# Patient Record
Sex: Female | Born: 1985
Health system: Southern US, Community
[De-identification: ages and names within clinical notes are randomized; demographics above are authoritative.]

## PROBLEM LIST (undated history)

## (undated) DIAGNOSIS — N390 Urinary tract infection, site not specified: Secondary | ICD-10-CM

## (undated) DIAGNOSIS — N83209 Unspecified ovarian cyst, unspecified side: Secondary | ICD-10-CM

## (undated) DIAGNOSIS — N12 Tubulo-interstitial nephritis, not specified as acute or chronic: Secondary | ICD-10-CM

## (undated) HISTORY — DX: Unspecified ovarian cyst, unspecified side: N83.209

## (undated) HISTORY — DX: Tubulo-interstitial nephritis, not specified as acute or chronic: N12

## (undated) HISTORY — DX: Urinary tract infection, site not specified: N39.0

---
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
## 2005-01-29 ENCOUNTER — Observation Stay: Payer: Self-pay

## 2005-02-06 ENCOUNTER — Inpatient Hospital Stay: Payer: Self-pay | Admitting: Obstetrics & Gynecology

## 2006-06-03 ENCOUNTER — Emergency Department: Payer: Self-pay | Admitting: Emergency Medicine

## 2006-10-21 ENCOUNTER — Observation Stay: Payer: Self-pay

## 2006-10-27 ENCOUNTER — Observation Stay: Payer: Self-pay | Admitting: Obstetrics & Gynecology

## 2006-11-11 ENCOUNTER — Observation Stay: Payer: Self-pay

## 2007-01-08 ENCOUNTER — Inpatient Hospital Stay: Payer: Self-pay

## 2007-02-09 ENCOUNTER — Observation Stay: Payer: Self-pay

## 2007-02-17 ENCOUNTER — Observation Stay: Payer: Self-pay | Admitting: Unknown Physician Specialty

## 2007-02-21 ENCOUNTER — Observation Stay: Payer: Self-pay | Admitting: Unknown Physician Specialty

## 2007-02-25 ENCOUNTER — Inpatient Hospital Stay: Payer: Self-pay | Admitting: Obstetrics & Gynecology

## 2007-03-20 ENCOUNTER — Emergency Department: Payer: Self-pay | Admitting: General Practice

## 2007-03-22 ENCOUNTER — Emergency Department: Payer: Self-pay | Admitting: Emergency Medicine

## 2008-07-13 ENCOUNTER — Emergency Department: Payer: Self-pay | Admitting: Unknown Physician Specialty

## 2008-11-05 ENCOUNTER — Ambulatory Visit: Payer: Self-pay | Admitting: Family Medicine

## 2008-11-07 ENCOUNTER — Ambulatory Visit: Payer: Self-pay | Admitting: Internal Medicine

## 2008-11-10 ENCOUNTER — Ambulatory Visit: Payer: Self-pay | Admitting: Internal Medicine

## 2009-12-27 ENCOUNTER — Emergency Department: Payer: Self-pay | Admitting: Unknown Physician Specialty

## 2010-12-30 HISTORY — PX: WRIST SURGERY: SHX841

## 2011-06-29 ENCOUNTER — Emergency Department: Payer: Self-pay | Admitting: Emergency Medicine

## 2011-07-09 ENCOUNTER — Ambulatory Visit: Payer: Self-pay | Admitting: Orthopedic Surgery

## 2012-03-22 IMAGING — CR LEFT WRIST - COMPLETE 3+ VIEW
1 series · 4 of 4 positions shown · non-contrast
Comparison: none

REASON FOR EXAM: fall
COMMENTS:   LMP: N/A

PROCEDURE:     DXR - DXR WRIST LT COMP WITH OBLIQUES  - June 29, 2011  [DATE]
RESULT:     Transverse fracture is identified along the distal radius
demonstrating medial dorsal angulation and displacement. This fracture
appears to possibly project along the physeal remnant.

[Series 1: view not recorded · 0.17mm/px · 4 of 4 slices shown]
[im 1/4]
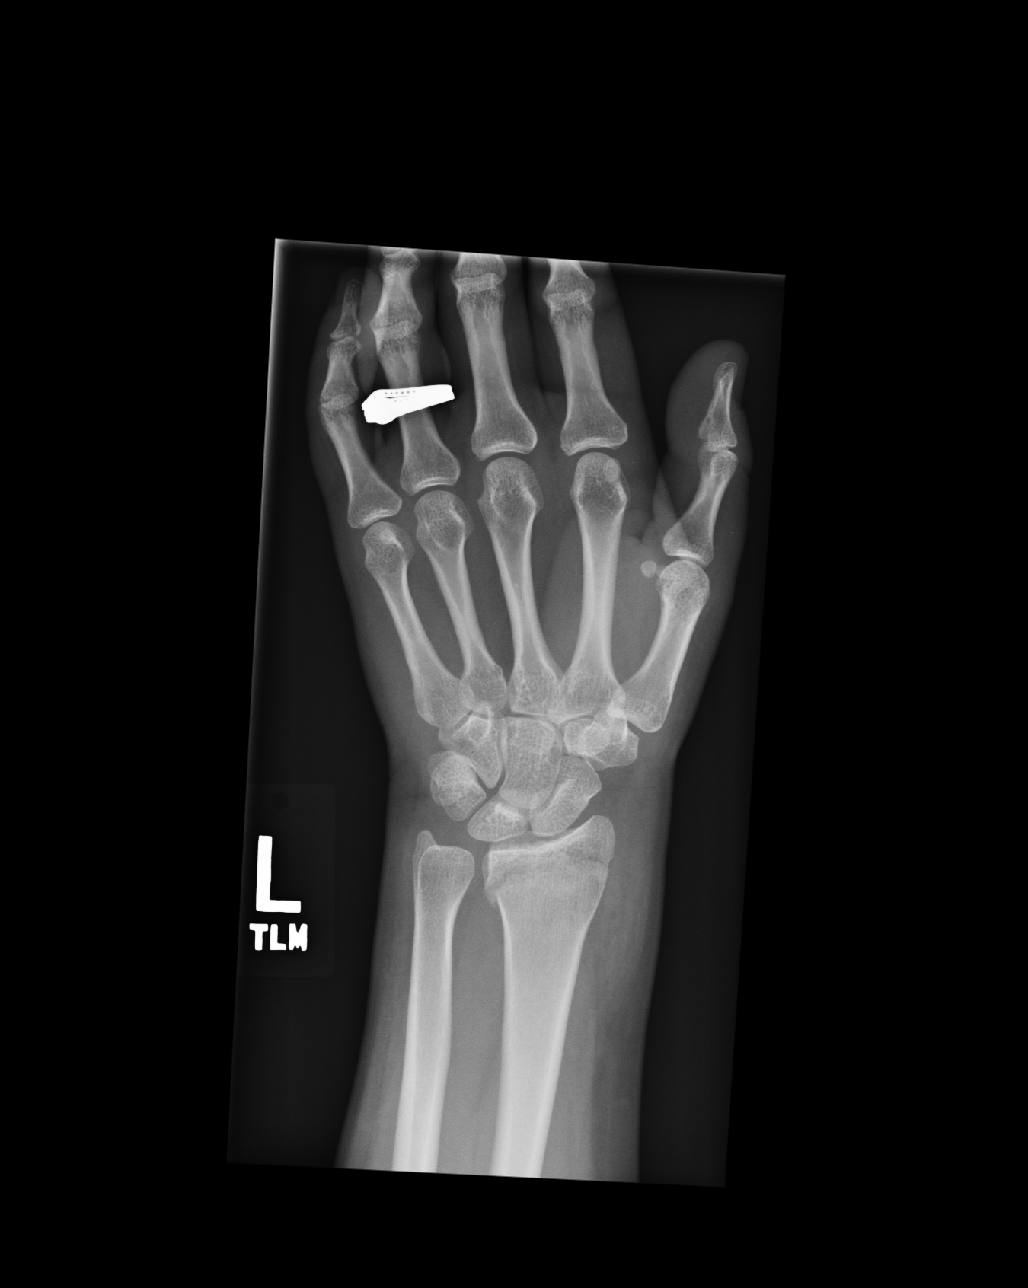
[im 2/4]
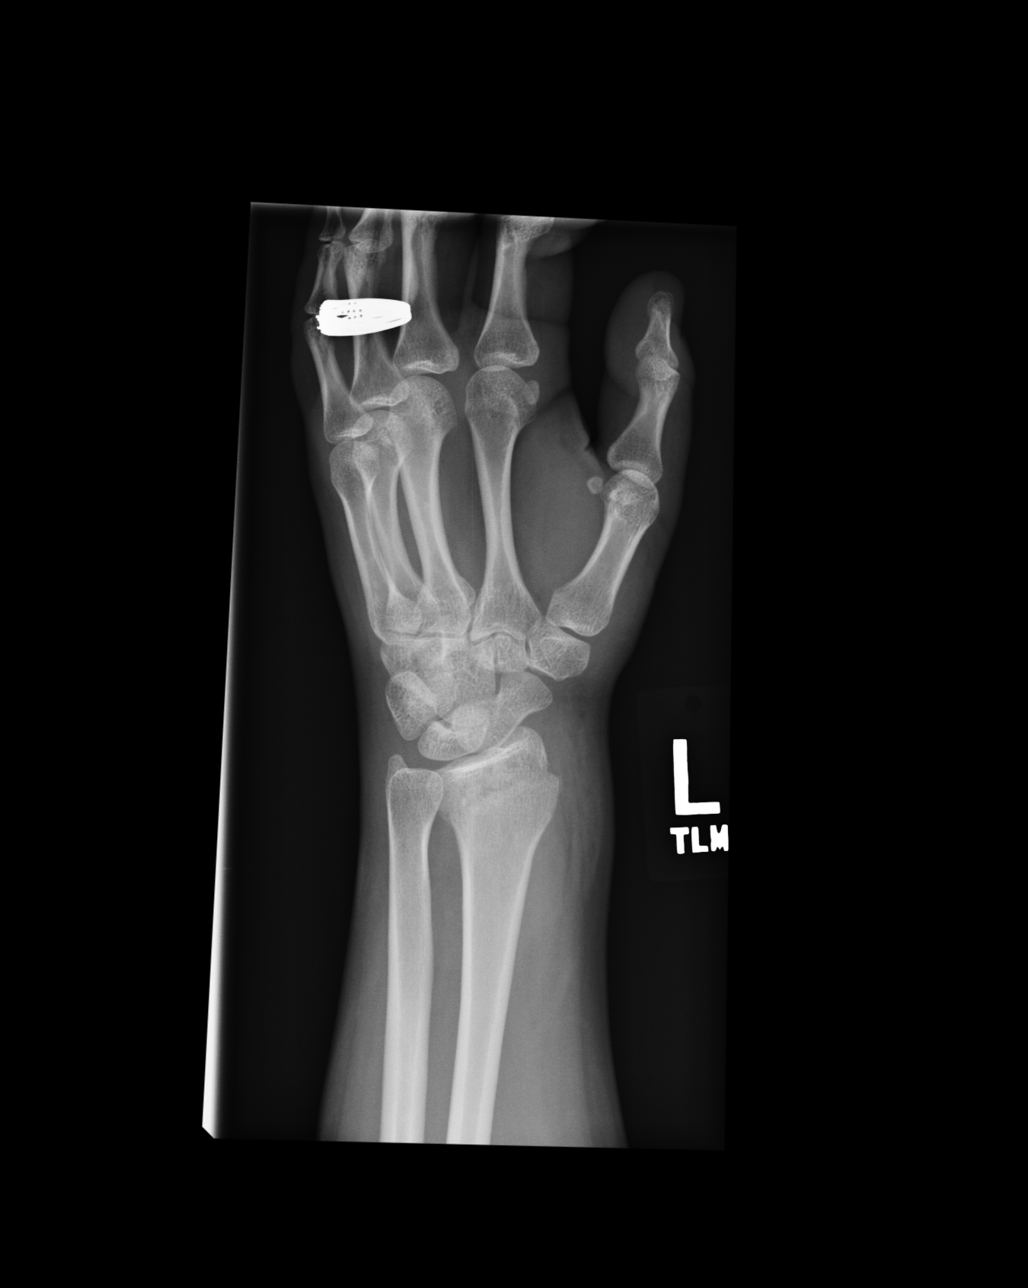
[im 3/4]
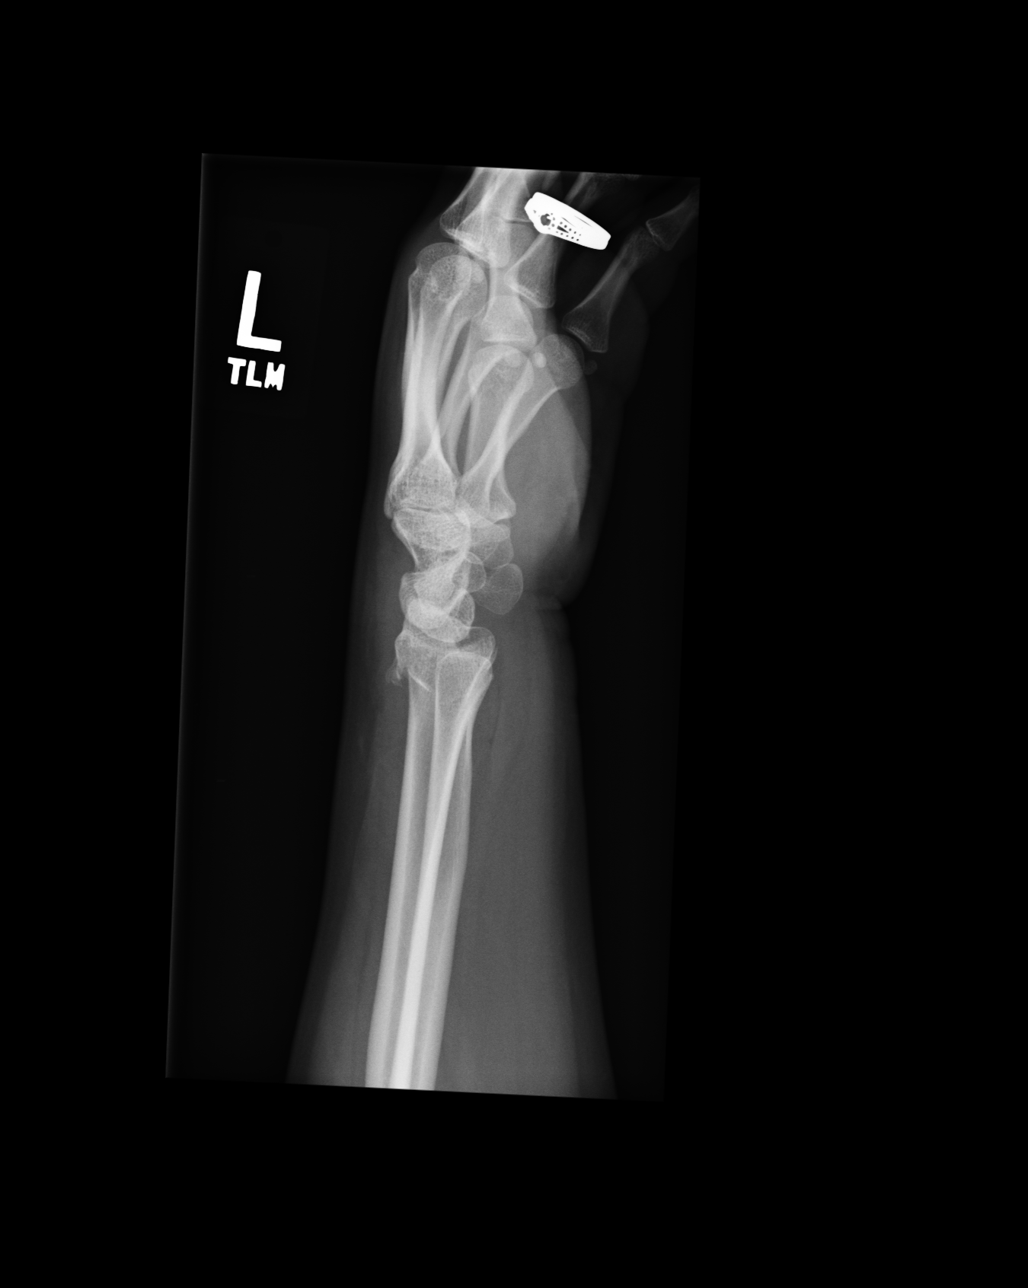
[im 4/4]
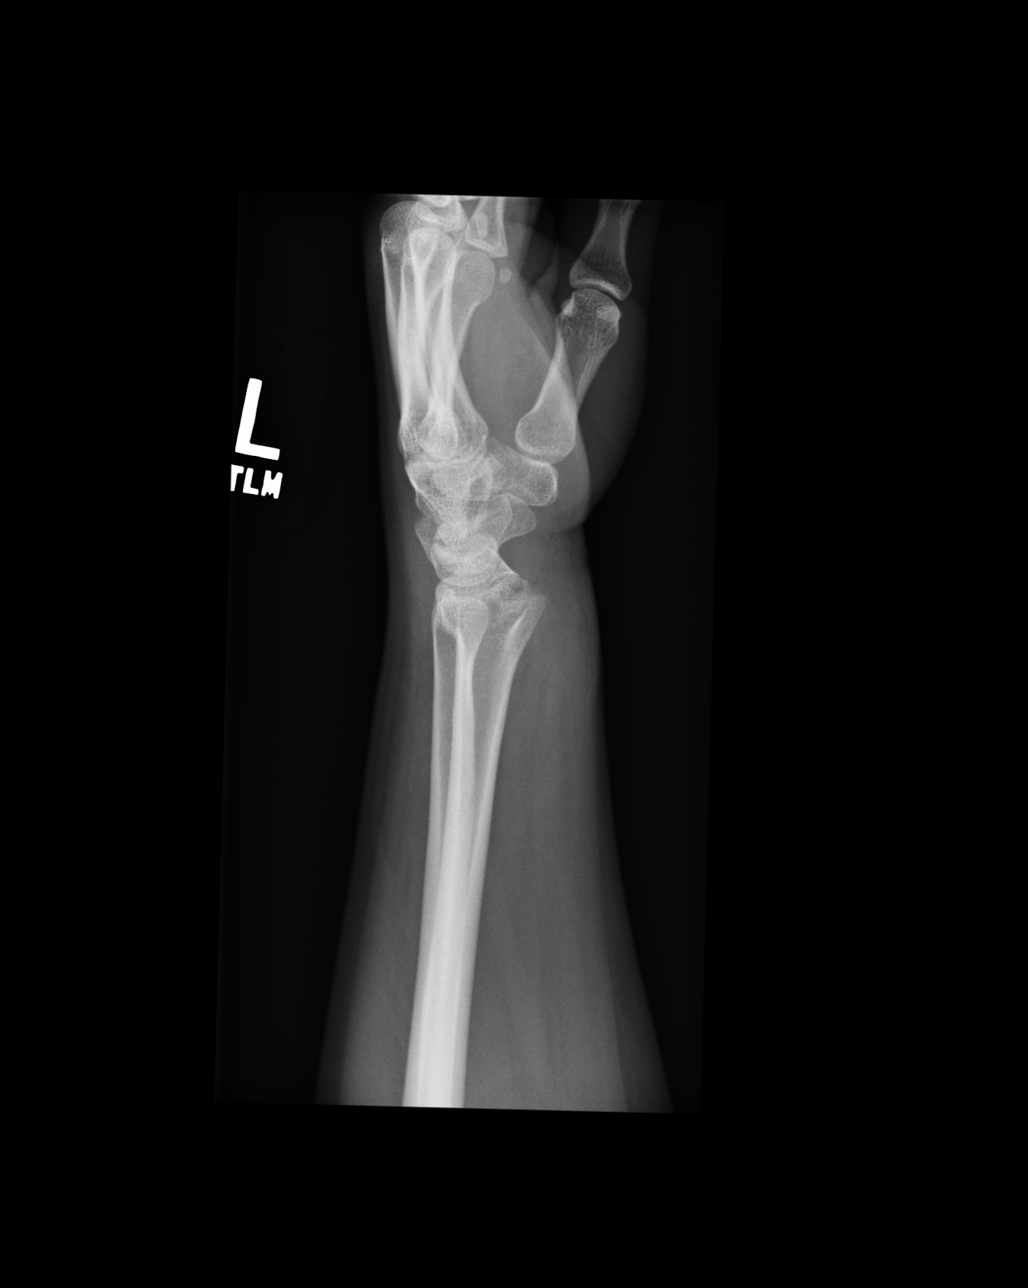

[4 of 4 positions shown; findings below may reference images not displayed]

IMPRESSION: Distal radius fracture.

## 2013-06-07 LAB — HM PAP SMEAR: HM Pap smear: NEGATIVE

## 2016-02-27 DIAGNOSIS — R300 Dysuria: Secondary | ICD-10-CM | POA: Diagnosis not present

## 2016-02-27 DIAGNOSIS — F17210 Nicotine dependence, cigarettes, uncomplicated: Secondary | ICD-10-CM | POA: Diagnosis not present

## 2016-02-27 DIAGNOSIS — Z6833 Body mass index (BMI) 33.0-33.9, adult: Secondary | ICD-10-CM | POA: Diagnosis not present

## 2016-04-11 ENCOUNTER — Encounter: Payer: Self-pay | Admitting: Family Medicine

## 2016-04-11 DIAGNOSIS — E669 Obesity, unspecified: Secondary | ICD-10-CM | POA: Diagnosis not present

## 2016-04-11 DIAGNOSIS — F17210 Nicotine dependence, cigarettes, uncomplicated: Secondary | ICD-10-CM | POA: Diagnosis not present

## 2016-04-11 DIAGNOSIS — Z0001 Encounter for general adult medical examination with abnormal findings: Secondary | ICD-10-CM | POA: Diagnosis not present

## 2016-04-11 DIAGNOSIS — Z124 Encounter for screening for malignant neoplasm of cervix: Secondary | ICD-10-CM | POA: Diagnosis not present

## 2016-04-11 DIAGNOSIS — N390 Urinary tract infection, site not specified: Secondary | ICD-10-CM | POA: Diagnosis not present

## 2016-04-15 LAB — HM PAP SMEAR: HM Pap smear: NEGATIVE

## 2016-07-03 ENCOUNTER — Encounter: Payer: Self-pay | Admitting: Family Medicine

## 2016-07-03 DIAGNOSIS — N390 Urinary tract infection, site not specified: Secondary | ICD-10-CM | POA: Diagnosis not present

## 2016-07-03 DIAGNOSIS — E669 Obesity, unspecified: Secondary | ICD-10-CM | POA: Diagnosis not present

## 2016-07-03 DIAGNOSIS — H60331 Swimmer's ear, right ear: Secondary | ICD-10-CM | POA: Diagnosis not present

## 2016-07-03 DIAGNOSIS — F17210 Nicotine dependence, cigarettes, uncomplicated: Secondary | ICD-10-CM | POA: Diagnosis not present

## 2016-08-09 DIAGNOSIS — J069 Acute upper respiratory infection, unspecified: Secondary | ICD-10-CM | POA: Diagnosis not present

## 2016-08-09 DIAGNOSIS — R300 Dysuria: Secondary | ICD-10-CM | POA: Diagnosis not present

## 2016-08-09 DIAGNOSIS — H6243 Otitis externa in other diseases classified elsewhere, bilateral: Secondary | ICD-10-CM | POA: Diagnosis not present

## 2016-08-09 DIAGNOSIS — F17210 Nicotine dependence, cigarettes, uncomplicated: Secondary | ICD-10-CM | POA: Diagnosis not present

## 2016-09-05 DIAGNOSIS — F17210 Nicotine dependence, cigarettes, uncomplicated: Secondary | ICD-10-CM | POA: Diagnosis not present

## 2016-09-05 DIAGNOSIS — R635 Abnormal weight gain: Secondary | ICD-10-CM | POA: Diagnosis not present

## 2016-09-05 DIAGNOSIS — Z6830 Body mass index (BMI) 30.0-30.9, adult: Secondary | ICD-10-CM | POA: Diagnosis not present

## 2016-11-05 DIAGNOSIS — R635 Abnormal weight gain: Secondary | ICD-10-CM | POA: Diagnosis not present

## 2016-11-05 DIAGNOSIS — F17210 Nicotine dependence, cigarettes, uncomplicated: Secondary | ICD-10-CM | POA: Diagnosis not present

## 2016-11-05 DIAGNOSIS — Z6830 Body mass index (BMI) 30.0-30.9, adult: Secondary | ICD-10-CM | POA: Diagnosis not present

## 2017-06-05 DIAGNOSIS — F17210 Nicotine dependence, cigarettes, uncomplicated: Secondary | ICD-10-CM | POA: Diagnosis not present

## 2017-06-05 DIAGNOSIS — E669 Obesity, unspecified: Secondary | ICD-10-CM | POA: Diagnosis not present

## 2017-06-05 DIAGNOSIS — N926 Irregular menstruation, unspecified: Secondary | ICD-10-CM | POA: Diagnosis not present

## 2017-06-09 ENCOUNTER — Encounter: Payer: Self-pay | Admitting: Family Medicine

## 2017-06-09 DIAGNOSIS — E069 Thyroiditis, unspecified: Secondary | ICD-10-CM | POA: Diagnosis not present

## 2017-06-09 DIAGNOSIS — D509 Iron deficiency anemia, unspecified: Secondary | ICD-10-CM | POA: Diagnosis not present

## 2017-06-09 LAB — HEPATIC FUNCTION PANEL
ALT: 14 (ref 7–35)
AST: 15 (ref 13–35)
Alkaline Phosphatase: 64 (ref 25–125)
Bilirubin, Total: 0.4

## 2017-06-09 LAB — CBC AND DIFFERENTIAL
HCT: 40 (ref 36–46)
Hemoglobin: 13.5 (ref 12.0–16.0)
Platelets: 269 (ref 150–399)
WBC: 7.8

## 2017-06-09 LAB — BASIC METABOLIC PANEL
BUN: 11 (ref 4–21)
Creatinine: 0.7 (ref 0.5–1.1)
Sodium: 141 (ref 137–147)

## 2017-06-09 LAB — IRON,TIBC AND FERRITIN PANEL
Ferritin: 97
Iron: 103

## 2017-06-09 LAB — TSH: TSH: 2.5 (ref 0.41–5.90)

## 2017-06-30 DIAGNOSIS — N926 Irregular menstruation, unspecified: Secondary | ICD-10-CM | POA: Diagnosis not present

## 2017-10-16 DIAGNOSIS — H8309 Labyrinthitis, unspecified ear: Secondary | ICD-10-CM | POA: Diagnosis not present

## 2017-11-26 DIAGNOSIS — H6243 Otitis externa in other diseases classified elsewhere, bilateral: Secondary | ICD-10-CM | POA: Diagnosis not present

## 2017-11-26 DIAGNOSIS — B359 Dermatophytosis, unspecified: Secondary | ICD-10-CM | POA: Diagnosis not present

## 2017-11-26 DIAGNOSIS — H8110 Benign paroxysmal vertigo, unspecified ear: Secondary | ICD-10-CM | POA: Diagnosis not present

## 2018-01-02 ENCOUNTER — Ambulatory Visit (INDEPENDENT_AMBULATORY_CARE_PROVIDER_SITE_OTHER): Payer: Self-pay | Admitting: Family

## 2018-01-02 ENCOUNTER — Encounter: Payer: Self-pay | Admitting: Family

## 2018-01-02 VITALS — BP 105/62 | HR 98 | Temp 98.2°F | Wt 155.0 lb

## 2018-01-02 DIAGNOSIS — J029 Acute pharyngitis, unspecified: Secondary | ICD-10-CM

## 2018-01-02 LAB — POCT INFLUENZA A/B
Influenza A, POC: NEGATIVE
Influenza B, POC: NEGATIVE

## 2018-01-02 LAB — POCT RAPID STREP A (OFFICE): Rapid Strep A Screen: NEGATIVE

## 2018-01-02 MED ORDER — AZITHROMYCIN 250 MG PO TABS: ORAL_TABLET | ORAL | 0 refills | Status: DC

## 2018-01-02 NOTE — Patient Instructions (Signed)

## 2018-01-02 NOTE — Progress Notes (Signed)
Subjective:     Patient ID: Anita Stanley, female   DOB: 1986-05-03, 32 y.o.   MRN: [id number]  HPI 32 year old female is in today with c/o sore throat, fatigue, headache, x 5 days and worsening. 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 Has been taking Ibuprofen and tylenol for fevers. Has not taken her temperature but feels she is having fevers.   Review of Systems  Constitutional: Positive for chills, fatigue and fever.  HENT: Positive for sore throat. Negative for rhinorrhea, sinus pain and voice change.   Respiratory: Negative for cough.   Cardiovascular: Negative.   Endocrine: Negative.   Allergic/Immunologic: Negative.   Neurological: Negative.   Hematological: Negative.    History reviewed. No pertinent past medical history.  Social History   Socioeconomic History  . Marital status: Married    Spouse name: Not on file  . Number of children: Not on file  . Years of education: Not on file  . Highest education level: Not on file  Social Needs  . Financial resource strain: Not on file  . Food insecurity - worry: Not on file  . Food insecurity - inability: Not on file  . Transportation needs - medical: Not on file  . Transportation needs - non-medical: Not on file  Occupational History  . Not on file  Tobacco Use  . Smoking status: Not on file  Substance and Sexual Activity  . Alcohol use: Not on file  . Drug use: Not on file  . Sexual activity: Not on file  Other Topics Concern  . Not on file  Social History Narrative  . Not on file    History reviewed. No pertinent surgical history.  History reviewed. No pertinent family history.  Allergies  Allergen Reactions  . Morphine And Related     Current Outpatient Medications on File Prior to Visit  Medication Sig Dispense Refill  . levonorgestrel (MIRENA) 20 MCG/24HR IUD 1 each by Intrauterine route once.     No current facility-administered medications on file prior to visit.      BP 105/62   Pulse 98   Temp 98.2 F (36.8 C)   Wt 155 lb (70.3 kg)   SpO2 97% chart    Objective:   Physical Exam  Constitutional: She is oriented to person, place, and time. She appears well-developed and well-nourished.  HENT:  Right Ear: External ear normal.  Left Ear: External ear normal.  Pharynx moderately red, no exudate  Neck: Normal range of motion. Neck supple.  Cardiovascular: Normal rate, regular rhythm and normal heart sounds.  Pulmonary/Chest: Effort normal and breath sounds normal.  Musculoskeletal: Normal range of motion.  Neurological: She is alert and oriented to person, place, and time.  Skin: Skin is warm and dry.  Psychiatric: She has a normal mood and affect.       Assessment:        Anita Folksmanda was seen today for focus-[place]-sorethroat/bodyache.  Diagnoses and all orders for this visit:  Sorethroat -     POCT Influenza A/B -     POCT rapid strep A  Pharyngitis, unspecified etiology  Other orders -     azithromycin (ZITHROMAX) 250 MG tablet; 2 Tabs today, then 1 tab daily x 4 more days   Plan:     Call the office with any questions or concerns. RTC if symptoms worsen or persist.

## 2018-06-24 ENCOUNTER — Ambulatory Visit (INDEPENDENT_AMBULATORY_CARE_PROVIDER_SITE_OTHER): Payer: No Typology Code available for payment source | Admitting: Family Medicine

## 2018-06-24 ENCOUNTER — Encounter: Payer: Self-pay | Admitting: Family Medicine

## 2018-06-24 VITALS — BP 100/60 | HR 76 | Temp 98.3°F | Ht 61.0 in | Wt 161.0 lb

## 2018-06-24 DIAGNOSIS — R350 Frequency of micturition: Secondary | ICD-10-CM

## 2018-06-24 DIAGNOSIS — R635 Abnormal weight gain: Secondary | ICD-10-CM

## 2018-06-24 DIAGNOSIS — R5383 Other fatigue: Secondary | ICD-10-CM | POA: Diagnosis not present

## 2018-06-24 DIAGNOSIS — Z0000 Encounter for general adult medical examination without abnormal findings: Secondary | ICD-10-CM | POA: Diagnosis not present

## 2018-06-24 NOTE — Patient Instructions (Signed)
Good to see you today  I will notify you of lab results   Keeping You Healthy  Get These Tests 1. Blood Pressure- Have your blood pressure checked once a year by your health care provider.  Normal blood pressure is 120/80. 2. Weight- Have your body mass index (BMI) calculated to screen for obesity.  BMI is measure of body fat based on height and weight.  You can also calculate your own BMI at https://www.west-esparza.com/www.nhlbisupport.com/bmi/. 3. Cholesterol- Have your cholesterol checked every 5 years starting at age 32 then yearly starting at age 645. 4. Chlamydia, HIV, and other sexually transmitted diseases- Get screened every year until age 32, then within three months of each new sexual provider. 5. Pap Test - Every 1-5 years; discuss with your health care provider. 6. Mammogram- Every 1-2 years starting at age 32--50  Take these medicines  Calcium with Vitamin D-Your body needs 1200 mg of Calcium each day and [phone] IU of Vitamin D daily.  Your body can only absorb 500 mg of Calcium at a time so Calcium must be taken in 2 or 3 divided doses throughout the day.  Multivitamin with folic acid- Once daily if it is possible for you to become pregnant.  Get these Immunizations  Gardasil-Series of three doses; prevents HPV related illness such as genital warts and cervical cancer.  Menactra-Single dose; prevents meningitis.  Tetanus shot- Every 10 years.  Flu shot-Every year.  Take these steps 1. Do not smoke-Your healthcare provider can help you quit.  For tips on how to quit go to www.smokefree.gov or call 1-800 QUITNOW. 2. Be physically active- Exercise 5 days a week for at least 30 minutes.  If you are not already physically active, start slow and gradually work up to 30 minutes of moderate physical activity.  Examples of moderate activity include walking briskly, dancing, swimming, bicycling, etc. 3. Breast Cancer- A self breast exam every month is important for early detection of breast cancer.  For  more information and instruction on self breast exams, ask your healthcare provider or [place].eswww.womenshealth.gov/faq/breast-self-exam.cfm. 4. Eat a healthy diet- Eat a variety of healthy foods such as fruits, vegetables, whole grains, low fat milk, low fat cheeses, yogurt, lean meats, poultry and fish, beans, nuts, tofu, etc.  For more information go to www. Thenutritionsource.org 5. Drink alcohol in moderation- Limit alcohol intake to one drink or less per day. Never drink and drive. 6. Depression- Your emotional health is as important as your physical health.  If you're feeling down or losing interest in things you normally enjoy please talk to your healthcare provider about being screened for depression. 7. Dental visit- Brush and floss your teeth twice daily; visit your dentist twice a year. 8. Eye doctor- Get an eye exam at least every 2 years. 9. Helmet use- Always wear a helmet when riding a bicycle, motorcycle, rollerblading or skateboarding. 10. Safe sex- If you may be exposed to sexually transmitted infections, use a condom. 11. Seat belts- Seat belts can save your live; always wear one. 12. Smoke/Carbon Monoxide detectors- These detectors need to be installed on the appropriate level of your home. Replace batteries at least once a year. 13. Skin cancer- When out in the sun please cover up and use sunscreen 15 SPF or higher. 14. Violence- If anyone is threatening or hurting you, please tell your healthcare provider.

## 2018-06-24 NOTE — Progress Notes (Signed)
Subjective:    Patient ID: Anita Stanley, female    DOB: 1986-02-15, 32 y.o.   MRN: [id number]  HPI This is a 32 yo female who presents today to establish care and for CPE. 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 She has a son and daughter (50, 39). Enjoys shopping, travel. Lives with husband and children. Has a dog.   Last CPE- sees gyn ([facility]) Pap- sees gyn Tdap- unknown, probably 2015 when she started working for [employer]- annual Eye- not regular Dental- regular Exercise- not regular Sleep- works third shift, sleeps 12 hours  Weight gain- has taken phentermine in past  No past medical history on file. No past surgical history on file. Family History  Problem Relation Age of Onset  . Diabetes Mother   . Diabetes Father   . Alcohol abuse Sister    Social History   Tobacco Use  . Smoking status: Current Some Day Smoker  . Smokeless tobacco: Never Used  Substance Use Topics  . Alcohol use: Yes    Comment: occ  . Drug use: Yes    Types: MDMA (Ecstacy)      Review of Systems  Constitutional: Positive for fatigue and unexpected weight change (gain).  HENT: Negative.   Eyes: Negative.   Respiratory: Negative.   Cardiovascular: Negative.   Gastrointestinal: Negative.   Endocrine: Positive for polyuria (longstanding).  Genitourinary: Negative.   Musculoskeletal: Negative.   Skin: Negative.   Allergic/Immunologic: Negative.   Neurological: Negative.   Hematological: Negative.   Psychiatric/Behavioral: Negative.        Objective:   Physical Exam Physical Exam  Constitutional: She is oriented to person, place, and time. She appears well-developed and well-nourished. No distress.  HENT:  Head: Normocephalic and atraumatic.  Right Ear: External ear normal.  Left Ear: External ear normal.  Nose: Nose normal.  Mouth/Throat: Oropharynx is clear and moist. No oropharyngeal exudate.  Eyes: Conjunctivae are normal.  Pupils are equal, round, and reactive to light.  Neck: Normal range of motion. Neck supple. No JVD present. No thyromegaly present.  Cardiovascular: Normal rate, regular rhythm, normal heart sounds and intact distal pulses.   Pulmonary/Chest: Effort normal and breath sounds normal. Right breast exhibits no inverted nipple, no mass, no nipple discharge, no skin change and no tenderness. Left breast exhibits no inverted nipple, no mass, no nipple discharge, no skin change and no tenderness. Breasts are symmetrical.  Abdominal: Soft. Bowel sounds are normal. She exhibits no distension and no mass. There is no tenderness. There is no rebound and no guarding.  Musculoskeletal: Normal range of motion. She exhibits no edema or tenderness.  Lymphadenopathy:    She has no cervical adenopathy.  Neurological: She is alert and oriented to person, place, and time. She has normal reflexes.  Skin: Skin is warm and dry. She is not diaphoretic.  Psychiatric: She has a normal mood and affect. Her behavior is normal. Judgment and thought content normal.  Vitals reviewed.     BP 100/60 (BP Location: Right Arm, Patient Position: Sitting, Cuff Size: Normal)   Pulse 76   Temp 98.3 F (36.8 C) (Oral)   Ht 5\' 1"  (1.549 m)   Wt 161 lb (73 kg)   SpO2 97%   BMI 30.42 kg/m  Wt Readings from Last 3 Encounters:  06/24/18 161 lb (73 kg)  01/02/18 155 lb (70.3 kg)   Depression screen [facility] 2/9 06/24/2018  Decreased  Interest 0  Down, Depressed, Hopeless 0  PHQ - 2 Score 0       Assessment & Plan:  1. Annual physical exam - will obtain outside records - Discussed and encouraged healthy lifestyle choices- adequate sleep, regular exercise, stress management and healthy food choices.   2. Weight gain - will check labs, discussed effects of sleep/exercise/stress - Comprehensive metabolic panel - TSH - Vitamin D, 25-hydroxy  3. Urinary frequency - Hemoglobin A1c  4. Other fatigue - CBC with  Differential   Olean Reeeborah Gessner, FNP-BC   [facility], [facility]  06/24/2018 5:15 PM

## 2018-06-25 LAB — COMPREHENSIVE METABOLIC PANEL
ALT: 18 U/L (ref 0–35)
AST: 15 U/L (ref 0–37)
Albumin: 4.5 g/dL (ref 3.5–5.2)
Alkaline Phosphatase: 47 U/L (ref 39–117)
BUN: 13 mg/dL (ref 6–23)
CO2: 28 mEq/L (ref 19–32)
Calcium: 9.2 mg/dL (ref 8.4–10.5)
Chloride: 106 mEq/L (ref 96–112)
Creatinine, Ser: 0.63 mg/dL (ref 0.40–1.20)
GFR: 116.15 mL/min (ref >60.00)
Glucose, Bld: 88 mg/dL (ref 70–99)
Potassium: 3.9 mEq/L (ref 3.5–5.1)
Sodium: 139 mEq/L (ref 135–145)
Total Bilirubin: 0.4 mg/dL (ref 0.2–1.2)
Total Protein: 6.8 g/dL (ref 6.0–8.3)

## 2018-06-25 LAB — CBC WITH DIFFERENTIAL/PLATELET
Basophils Absolute: 0.1 10*3/uL (ref 0.0–0.1)
Basophils Relative: 0.8 % (ref 0.0–3.0)
Eosinophils Absolute: 0.2 10*3/uL (ref 0.0–0.7)
Eosinophils Relative: 2.4 % (ref 0.0–5.0)
HCT: 38 % (ref 36.0–46.0)
Hemoglobin: 13 g/dL (ref 12.0–15.0)
Lymphocytes Relative: 23.3 % (ref 12.0–46.0)
Lymphs Abs: 1.8 10*3/uL (ref 0.7–4.0)
MCHC: 34.3 g/dL (ref 30.0–36.0)
MCV: 92.5 fl (ref 78.0–100.0)
Monocytes Absolute: 0.5 10*3/uL (ref 0.1–1.0)
Monocytes Relative: 7.2 % (ref 3.0–12.0)
Neutro Abs: 5 10*3/uL (ref 1.4–7.7)
Neutrophils Relative %: 66.3 % (ref 43.0–77.0)
Platelets: 207 10*3/uL (ref 150.0–400.0)
RBC: 4.11 Mil/uL (ref 3.87–5.11)
RDW: 11.9 % (ref 11.5–15.5)
WBC: 7.6 10*3/uL (ref 4.0–10.5)

## 2018-06-25 LAB — HEMOGLOBIN A1C: Hgb A1c MFr Bld: 5.5 % (ref 4.6–6.5)

## 2018-06-25 LAB — VITAMIN D 25 HYDROXY (VIT D DEFICIENCY, FRACTURES): VITD: 32.78 ng/mL (ref 30.00–100.00)

## 2018-06-25 LAB — TSH: TSH: 0.88 u[IU]/mL (ref 0.35–4.50)

## 2018-07-24 ENCOUNTER — Encounter: Payer: Self-pay | Admitting: Family Medicine

## 2018-08-03 ENCOUNTER — Encounter: Payer: Self-pay | Admitting: Family Medicine

## 2018-10-20 ENCOUNTER — Encounter: Payer: Self-pay | Admitting: Physician Assistant

## 2018-10-20 ENCOUNTER — Ambulatory Visit (INDEPENDENT_AMBULATORY_CARE_PROVIDER_SITE_OTHER): Payer: Self-pay | Admitting: Physician Assistant

## 2018-10-20 VITALS — BP 110/80 | HR 64 | Temp 97.8°F | Wt 170.0 lb

## 2018-10-20 DIAGNOSIS — L0201 Cutaneous abscess of face: Secondary | ICD-10-CM

## 2018-10-20 MED ORDER — SULFAMETHOXAZOLE-TRIMETHOPRIM 800-160 MG PO TABS: 1.0000 | ORAL_TABLET | Freq: Two times a day (BID) | ORAL | 0 refills | Status: AC

## 2018-10-20 MED ORDER — IBUPROFEN 800 MG PO TABS: 800.0000 mg | ORAL_TABLET | Freq: Three times a day (TID) | ORAL | 0 refills | Status: AC | PRN

## 2018-10-20 NOTE — Patient Instructions (Signed)
Thank you for choosing [facility] for your health care needs.  Today, you have been diagnosed with a facial skin infection (celluliits/abscess).  Recommend you apply warm compress, 15-20 minutes at a time, several times a day. Take prescription ibuprofen 800mg . 1 tablet every 8 hours for pain/swelling. Take with food to prevent stomach upset. Take prescription antibiotic, Bactrim. 1 tablet twice a day x 7 days.  Return to clinic in 2 days for re-evaluation. Return sooner with any issues/worsening symptoms.  You have been provided a work excuse note from now until Thursday.  Skin Abscess  A skin abscess is an infected area on or under your skin that contains pus and other material. An abscess can happen almost anywhere on your body. Some abscesses break open (rupture) on their own. Most continue to get worse unless they are treated. The infection can spread deeper into the body and into your blood, which can make you feel sick. Treatment usually involves draining the abscess. Follow these instructions at home: Abscess Care  If you have an abscess that has not drained, place a warm, clean, wet washcloth over the abscess several times a day. Do this as told by your doctor.  Follow instructions from your doctor about how to take care of your abscess. Make sure you: ? Cover the abscess with a bandage (dressing). ? Change your bandage or gauze as told by your doctor. ? Wash your hands with soap and water before you change the bandage or gauze. If you cannot use soap and water, use hand sanitizer.  Check your abscess every day for signs that the infection is getting worse. Check for: ? More redness, swelling, or pain. ? More fluid or blood. ? Warmth. ? More pus or a bad smell. Medicines   Take over-the-counter and prescription medicines only as told by your doctor.  If you were prescribed an antibiotic medicine, take it as told by your doctor. Do not stop taking the antibiotic even if you  start to feel better. General instructions  To avoid spreading the infection: ? Do not share personal care items, towels, or hot tubs with others. ? Avoid making skin-to-skin contact with other people.  Keep all follow-up visits as told by your doctor. This is important. Contact a doctor if:  You have more redness, swelling, or pain around your abscess.  You have more fluid or blood coming from your abscess.  Your abscess feels warm when you touch it.  You have more pus or a bad smell coming from your abscess.  You have a fever.  Your muscles ache.  You have chills.  You feel sick. Get help right away if:  You have very bad (severe) pain.  You see red streaks on your skin spreading away from the abscess. This information is not intended to replace advice given to you by your health care provider. Make sure you discuss any questions you have with your health care provider. Document Released: 06/03/2008 Document Revised: 08/11/2016 Document Reviewed: 10/25/2015 Elsevier Interactive Patient Education  [employer].

## 2018-10-20 NOTE — Progress Notes (Signed)
Patient ID: Anita Stanley DOB: [birth year] AGE: 32 y.o. MRN: [id number]   PCP: Emi Belfast, FNP   Chief Complaint:  Chief Complaint  Patient presents with  . focus-lip swollen     Subjective:    HPI:  Anita Stanley is a 32 y.o. female presents for evaluation  Chief Complaint  Patient presents with  . focus-lip swollen   32 year old female presents to [facility] with two day history of left lower lip swelling. Began yesterday afternoon, 4pm. Patient works 3rd shift, woke up from sleeping, felt tingling on left lower lip. Looked in the mirror, left lower lip was swollen. Has developed associated pain; describes as swollen/pressure. Patient with small pimple on outer portion of left lower lip; noticed same time as lip swelling. Has taken over the counter Benadryl; suspected possible spider bite, no improvement. Has taken over the counter ibuprofen for pain, no improvement. Denies fever, chills, headache, dizziness/lightheadedness, ear pain, cold symptoms, sore throat, teeth/gum issues, lip bleeding or purulent drainage. Denies chest pain, SOB, wheezing.  Patient with no previous history of similar symptoms. Patient denies poor dental health. Patient with previous history of cold sores/herpes simplex. No recent eruption. Patient with previous diagnosis of MRSA; in right ear canal. Required I&D from ENT physician. Culture revealed MRSA. Resolved with I&D and oral Bactrim. Patient recently returned from vacation in [place] (hot humid weather), states may have caused skin sensitivity; however, other than single pimple, no additional acne eruption.   A complete, at least 10 system review of symptoms was performed, pertinent positives and negatives as mentioned in HPI, otherwise negative.  The following portions of the patient's history were reviewed and updated as appropriate: allergies, current medications and past medical history.  There are no active problems to  display for this patient.   Allergies  Allergen Reactions  . Morphine And Related     Current Outpatient Medications on File Prior to Visit  Medication Sig Dispense Refill  . levonorgestrel (MIRENA) 20 MCG/24HR IUD 1 each by Intrauterine route once.     No current facility-administered medications on file prior to visit.        Objective:    Vitals:   10/20/18 0917  BP: 110/80  Pulse: 64  Temp: 97.8 F (36.6 C)  SpO2: 98%     Wt Readings from Last 3 Encounters:  10/20/18 170 lb (77.1 kg)  06/24/18 161 lb (73 kg)  01/02/18 155 lb (70.3 kg)    Physical Exam:   General Appearance:  Alert, cooperative, no distress, appears stated age. Afebrile.  Head:  Normocephalic, without obvious abnormality, atraumatic  Eyes:  PERRL, conjunctiva/corneas clear, EOM's intact, fundi benign, both eyes  Ears:  Normal TM's and external ear canals, both ears  Nose: Nares normal, septum midline, mucosa normal, no drainage or sinus tenderness  Throat: Left lower lip reveals moderate edema. No break in skin. No induration or fluctuance; soft to palpation. No palpable warmth. Significant tenderness with palpation. No visible cold sore or canker sore. Minimal 0.5cm raised erythematous papule on outer portion of left lower lip consistent with pimple. No edema. Minimal tenderness. No drainage. Gums and teeth appear healthy. No gum bleeding or swelling. No dental abscess. No tenderness with palpation of teeth. Throat normal in appearance. No erythema. No exudate.  Neck: Supple, symmetrical, trachea midline, no adenopathy;  thyroid: not enlarged, symmetric, no tenderness/mass/nodules; no carotid bruit or JVD  Back:   Symmetric, no curvature, ROM normal, no CVA tenderness  Lungs:   Clear to auscultation bilaterally, respirations unlabored  Heart:  Regular rate and rhythm, S1 and S2 normal, no murmur, rub, or gallop  Abdomen:   Soft, non-tender, bowel sounds active all four quadrants,  no masses, no  organomegaly  Extremities: Extremities normal, atraumatic, no cyanosis or edema  Pulses: 2+ and symmetric  Skin: Skin color, texture, turgor normal, no rashes or lesions. Face with no additional lesions.  Lymph nodes: Cervical, supraclavicular, and axillary nodes normal  Neurologic: Normal    Assessment & Plan:    Exam findings, diagnosis etiology and medication use and indications reviewed with patient. Follow-Up and discharge instructions provided. No emergent/urgent issues found on exam.  Patient education was provided.   Patient verbalized understanding of information provided and agrees with plan of care (POC), all questions answered. The patient is advised to call or return to clinic if condition does not see an improvement in symptoms, or to seek the care of the closest emergency department if condition worsens with the above plan.    1. Facial abscess  - sulfamethoxazole-trimethoprim (BACTRIM DS) 800-160 MG tablet; Take 1 tablet by mouth 2 (two) times daily for 7 days.  Dispense: 14 tablet; Refill: 0 - ibuprofen (ADVIL,MOTRIN) 800 MG tablet; Take 1 tablet (800 mg total) by mouth every 8 (eight) hours as needed for up to 7 days for moderate pain.  Dispense: 20 tablet; Refill: 0  Patient with two day history of left lower lip swelling. Suspect cellulitis/abscess secondary to pimple. Differential includes herpes zoster, herpes simplex/cold sore with secondary bacterial cellulitis, etc. Prescribed Bactrim and rx ibuprofen (for pain/swelling). Patient scheduled to return to clinic in 2 days for re-evaluation, sooner with any symptom worsening. At that time, if minimal improvement, based on symptom progression, may refer to general surgeon for I&D vs ED for IV antibiotics vs other. Patient agrees with plan. Work note given to patient, excusing patient from work, from now until scheduled f/u.  Rulon Sera, MHS, PA-C Advanced Practice Provider [facility]  [address], [facility], [address]st Floor [place], [place] [address] (p):  [phone] [email or web address]

## 2018-10-22 ENCOUNTER — Telehealth: Payer: Self-pay | Admitting: Family Medicine

## 2018-10-22 ENCOUNTER — Ambulatory Visit (INDEPENDENT_AMBULATORY_CARE_PROVIDER_SITE_OTHER): Payer: Self-pay | Admitting: Physician Assistant

## 2018-10-22 ENCOUNTER — Encounter: Payer: Self-pay | Admitting: Physician Assistant

## 2018-10-22 VITALS — BP 110/80 | HR 60 | Temp 97.9°F

## 2018-10-22 DIAGNOSIS — L0201 Cutaneous abscess of face: Secondary | ICD-10-CM

## 2018-10-22 NOTE — Progress Notes (Signed)
Patient ID: ROMANDA TURRUBIATES DOB: Jan 17, 1986 AGE: 32 y.o. MRN: [id number]   PCP: Emi Belfast, FNP   Chief Complaint:  Chief Complaint  Patient presents with  . focus-fol-up lip swollen     Subjective:    HPI:  IOANNA COLQUHOUN is a 32 y.o. female presents for evaluation  Chief Complaint  Patient presents with  . focus-fol-up lip swollen   Patient seen at [facility] on 10/20/2018, two days ago, with significantly swollen left lower lip. Diagnosed with abscess/cellulitis. No I&D performed due to no induration/fluctuance and location. Patient prescribed Bactrim and Ibuprofen 800mg , and advised to f/u in two days for re-evaluation.  Patient return to clinic today with decrease in swelling of left lower lip; however, increase in pain/discomfort. Reports throbbing/pulsating pain. Radiates along left jaw. Worse at night, causing difficulty sleeping. Has been taking ibuprofen. Has been applying ice and warm compresses. Reports pain with brushing teeth. No difficulty eating. Continues to deny fever, chills, headache, ear pain, sinus pain, nose pain, difficulty swallowing, swelling/bleeding/pain in gums, purulent drainage, bleeding from lesion.   A complete, at least 10 system review of symptoms was performed, pertinent positives and negatives as mentioned in HPI, otherwise negative.  The following portions of the patient's history were reviewed and updated as appropriate: allergies, current medications and past medical history.  There are no active problems to display for this patient.   Allergies  Allergen Reactions  . Morphine And Related     Current Outpatient Medications on File Prior to Visit  Medication Sig Dispense Refill  . ibuprofen (ADVIL,MOTRIN) 800 MG tablet Take 1 tablet (800 mg total) by mouth every 8 (eight) hours as needed for up to 7 days for moderate pain. 20 tablet 0  . levonorgestrel (MIRENA) 20 MCG/24HR IUD 1 each by Intrauterine route  once.    . sulfamethoxazole-trimethoprim (BACTRIM DS) 800-160 MG tablet Take 1 tablet by mouth 2 (two) times daily for 7 days. 14 tablet 0   No current facility-administered medications on file prior to visit.        Objective:    Vitals:   10/22/18 0927  BP: 110/80  Pulse: 60  Temp: 97.9 F (36.6 C)  SpO2: 97%     Wt Readings from Last 3 Encounters:  10/20/18 170 lb (77.1 kg)  06/24/18 161 lb (73 kg)  01/02/18 155 lb (70.3 kg)    Physical Exam:   General Appearance:  Alert, cooperative, no distress, appears stated age. Afebrile. In no acute distress. Mild change in voice secondary to left lower lip swelling.  Head:  Normocephalic, without obvious abnormality, atraumatic  Eyes:  PERRL, conjunctiva/corneas clear, EOM's intact, fundi benign, both eyes  Ears:  Normal TM's and external ear canals, both ears  Nose: Nares normal, septum midline, mucosa normal, no drainage or sinus tenderness  Throat: Left lower lip reveals mild edema, less than at last appt. 1cm erythematous papule on outer portion of left lower lip; not connected to [place] border. Palpable induration, 1cm circular mass. Palpable on inner portion of lip as well. Significant tenderness with palpation. No palpable fluctuance. No associated surrounding erythema. No streaking redness. No palpable warmth. Teeth and gums WNL. No teeth tenderness. No gum bleeding/edema. No visible purulent drainage.  Neck: Supple, symmetrical, trachea midline, no lymphadenopathy;  thyroid: not enlarged, symmetric, no tenderness/mass/nodules; no carotid bruit or JVD  Back:   Symmetric, no curvature, ROM normal, no CVA tenderness  Lungs:   Clear to auscultation bilaterally, respirations  unlabored  Heart:  Regular rate and rhythm, S1 and S2 normal, no murmur, rub, or gallop  Abdomen:   Soft, non-tender, bowel sounds active all four quadrants,  no masses, no organomegaly  Extremities: Extremities normal, atraumatic, no cyanosis or edema   Pulses: 2+ and symmetric  Skin: Skin color, texture, turgor normal, no rashes or lesions  Lymph nodes: Cervical, supraclavicular, and axillary nodes normal  Neurologic: Normal    Assessment & Plan:    Exam findings, diagnosis etiology and medication use and indications reviewed with patient. Follow-Up and discharge instructions provided. No emergent/urgent issues found on exam.  Patient education was provided.   Patient verbalized understanding of information provided and agrees with plan of care (POC), all questions answered. The patient is advised to call or return to clinic if condition does not see an improvement in symptoms, or to seek the care of the closest emergency department if condition worsens with the above plan.    1. Facial abscess  Patient with 2 day f/u in regards to left lower lip abscess. Antibiotic has improved swelling. Abscess has become more indurated. Believe I&D is most appropriate treatment. Due to location, believe procedure should not be performed at [facility]. Also, at time of I&D, wound culture would be appropriate. Called patient's previous ENT, [facility]. Patient scheduled to be seen today; they will provide further evaluation and treatment.    Rulon Sera, MHS, PA-C Advanced Practice Provider [facility]  [address] [facility], [address] [place], [place] [address] (p):  [phone] [email or web address]

## 2018-10-22 NOTE — Telephone Encounter (Signed)
I spoke with [facility] and was advised that pt should call [facility] and let them know pt was seen at [facility] and has urgent appt to see Dr Jenne Campus today. Pt said she is on the phone with [facility] now. Pt will cb if needed. FYI to Harlin Heys FNP.

## 2018-10-22 NOTE — Telephone Encounter (Signed)
Copied from CRM [id number]. Topic: Referral - Request for Referral >> Oct 22, 2018 10:29 AM Maia Petties wrote: Has patient seen PCP for this complaint? No - [facility] [place] and [facility] plan requiring urgent referral for pt to be seen as "coordinated" visit *If NO, is insurance requiring patient see PCP for this issue before PCP can refer them? yes Referral for which specialty: Abcess Preferred provider/office: [facility] Dr. Jenne Campus - today at 2:15pm Reason for referral: abcess/cellulitis below lip - advised needed urgent appt with ENT

## 2018-10-23 NOTE — Telephone Encounter (Signed)
Noted  

## 2018-10-23 NOTE — Patient Instructions (Signed)
Thank you for choosing [facility] for your health care needs today.  [facility] will be calling you in a few minutes with a time today for an appointment, to be evaluated by one of their physicians.  Call [facility] if any new issues/problems arise that we can help you with.

## 2018-10-26 ENCOUNTER — Telehealth: Payer: Self-pay | Admitting: Emergency Medicine

## 2018-10-26 NOTE — Telephone Encounter (Signed)
LM following up on her visit with specialist for lip swelling

## 2019-01-28 ENCOUNTER — Ambulatory Visit (INDEPENDENT_AMBULATORY_CARE_PROVIDER_SITE_OTHER): Payer: Self-pay | Admitting: Physician Assistant

## 2019-01-28 VITALS — BP 120/80 | HR 84 | Temp 98.4°F | Resp 20 | Wt 175.8 lb

## 2019-01-28 DIAGNOSIS — R69 Illness, unspecified: Secondary | ICD-10-CM

## 2019-01-28 DIAGNOSIS — J111 Influenza due to unidentified influenza virus with other respiratory manifestations: Secondary | ICD-10-CM

## 2019-01-28 DIAGNOSIS — R52 Pain, unspecified: Secondary | ICD-10-CM

## 2019-01-28 LAB — POCT INFLUENZA A/B
Influenza A, POC: NEGATIVE
Influenza B, POC: NEGATIVE

## 2019-01-28 LAB — POCT RAPID STREP A (OFFICE): Rapid Strep A Screen: NEGATIVE

## 2019-01-28 MED ORDER — BENZOCAINE-MENTHOL 15-3.6 MG MT LOZG: 1.0000 | LOZENGE | OROMUCOSAL | 0 refills | Status: DC | PRN

## 2019-01-28 MED ORDER — OSELTAMIVIR PHOSPHATE 75 MG PO CAPS: 75.0000 mg | ORAL_CAPSULE | Freq: Two times a day (BID) | ORAL | 0 refills | Status: DC

## 2019-01-28 NOTE — Progress Notes (Signed)
MRN: [id number] DOB: [birth year]  Subjective:   Anita Stanley is a 33 y.o. female presenting for chief complaint of [facility]- sore throat, body aches .  Sudden onset body aches, subjective fever, sore throat, and nasal congestion x 1 day. Felt okay at work yesterday then when she got home it hit her out of no where. 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 Notes he was coughing a whole lot without a face mask on. Denies sinus pain, inability to swallow, voice change, dry cough, productive cough, wheezing, shortness of breath and chest pain, vomiting, abdominal pain and diarrhea. Has tried tylenol with some relief, last took some before arrival.  Denies history of seasonal allergies, asthma, DM, HTN, or autoimmune disease.  Patient has had flu shot this season. Denies smoking.  LMP ~01/14/19, has IUD in place. Denies any other aggravating or relieving factors, no other questions or concerns.  Review of Systems  Constitutional: Negative for diaphoresis.  Gastrointestinal: Positive for nausea (mild nausea).  Genitourinary: Negative for dysuria, flank pain, frequency, hematuria and urgency.  Skin: Negative for rash.  Neurological: Negative for dizziness and weakness.    Rogers has a current medication list which includes the following prescription(s): benzocaine-menthol, levonorgestrel, and oseltamivir. Also is allergic to morphine and related.  [facility]  has no past medical history on file. Also  has no past surgical history on file.   Objective:   Vitals: BP 120/80 (BP Location: Right Arm, Patient Position: Sitting, Cuff Size: Normal)   Pulse 84   Temp 98.4 F (36.9 C) (Oral)   Resp 20   Wt 175 lb 12.8 oz (79.7 kg)   SpO2 98%   BMI 33.22 kg/m   Physical Exam Vitals signs reviewed.  Constitutional:      General: She is not in acute distress.    Appearance: She is well-developed. She is not ill-appearing or  toxic-appearing.  HENT:     Head: Normocephalic and atraumatic.     Right Ear: Ear canal and external ear normal. A middle ear effusion is present. Tympanic membrane is not erythematous or bulging.     Left Ear: Ear canal and external ear normal. A middle ear effusion is present. Tympanic membrane is not erythematous or bulging.     Nose: Congestion present.     Right Sinus: No maxillary sinus tenderness or frontal sinus tenderness.     Left Sinus: No maxillary sinus tenderness or frontal sinus tenderness.     Mouth/Throat:     Lips: Pink.     Mouth: Mucous membranes are moist.     Pharynx: Uvula midline. Posterior oropharyngeal erythema present. No pharyngeal swelling, oropharyngeal exudate or uvula swelling.     Tonsils: No tonsillar exudate or tonsillar abscesses. Swelling: 1+ on the right. 1+ on the left.  Eyes:     Conjunctiva/sclera: Conjunctivae normal.  Neck:     Musculoskeletal: Normal range of motion.  Cardiovascular:     Rate and Rhythm: Normal rate and regular rhythm.     Heart sounds: Normal heart sounds.  Pulmonary:     Effort: Pulmonary effort is normal.     Breath sounds: Normal breath sounds. No decreased breath sounds, wheezing, rhonchi or rales.  Abdominal:     General: Abdomen is flat.     Palpations: Abdomen is soft.     Tenderness: There is no abdominal tenderness. There is no right CVA tenderness or left  CVA tenderness.  Lymphadenopathy:     Head:     Right side of head: No submental, submandibular, tonsillar, preauricular, posterior auricular or occipital adenopathy.     Left side of head: No submental, submandibular, tonsillar, preauricular, posterior auricular or occipital adenopathy.     Cervical: No cervical adenopathy.     Upper Body:     Right upper body: No supraclavicular adenopathy.     Left upper body: No supraclavicular adenopathy.  Skin:    General: Skin is warm and dry.  Neurological:     Mental Status: She is alert.     Results for  orders placed or performed in visit on 01/28/19 (from the past 24 hour(s))  POCT rapid strep A     Status: Normal   Collection Time: 01/28/19 12:28 PM  Result Value Ref Range   Rapid Strep A Screen Negative Negative  POCT Influenza A/B     Status: Normal   Collection Time: 01/28/19 12:29 PM  Result Value Ref Range   Influenza A, POC Negative Negative   Influenza B, POC Negative Negative    Assessment and Plan :  1. Influenza-like illness Pt is overall well appearing, NAD. With sudden onset sx set and recent close contact exposure to influenza, influenza most likely dx despite neg POC test. POC strep also negative.  Discussed natural history of the disease and treatment options; including supportive care and antiviral therapy.  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 She will consider taking tamiflu, Rx provided. Also encouraged rest, hydration, and to continue OTC tylenol or ibuprofen as prescribed for fever. Educated on proper Special educational needs teacher. Recommended wearing a mask daily especially around other people.  Educated on potential complications of the flu. Advised to follow up with family doctor, local urgent care, or ED if develops any of these concerning symptoms or if current symptoms persist outside of discussed boundaries. - oseltamivir (TAMIFLU) 75 MG capsule; Take 1 capsule (75 mg total) by mouth 2 (two) times daily.  Dispense: 10 capsule; Refill: 0 - Benzocaine-Menthol (CEPACOL SORE THROAT) 15-3.6 MG LOZG; Use as directed 1 lozenge in the mouth or throat every 4 (four) hours as needed.  Dispense: 18 each; Refill: 0  2. Body aches - POCT rapid strep A - POCT Influenza A/B   Benjiman Core, PA-C  [facility] 01/28/2019 12:55 PM

## 2019-01-28 NOTE — Patient Instructions (Signed)
Influenza, Adult  Your symptoms are consistent with the flu. I have sent in tamiflu. It is most effective if started within 48 hours of when symptoms start. -Rest, increase fluids, and eat light meals. -OTC cIbuprofen or Tylenol for pain, fever, or general discomfort. - You may also use cepacol lozenges for sore throat and can drink warm tea with honey, lemon, and ginger to help with sore throat.  - Drink plenty of water, at least 64 ounces daily and rest to make sure your body has a chance to get better. -Use a humidifier or vaporizer when at home and during sleep to help nasal congestion. -Nasal saline drops can also help with nasal congeston -Wear mask when around others and continue with hand hygiene. Follow up with family doctor if no improvement in 7-10 days. Seek care sooner if symptoms worsen/develop new concerning symptoms.    Influenza is also called "the flu." It is an infection in the lungs, nose, and throat (respiratory tract). It is caused by a virus. The flu causes symptoms that are similar to symptoms of a cold. It also causes a high fever and body aches. The flu spreads easily from person to person (is contagious). Getting a flu shot (influenza vaccination) every year is the best way to prevent the flu. What are the causes? This condition is caused by the influenza virus. You can get the virus by:  Breathing in droplets that are in the air from the cough or sneeze of a person who has the virus.  Touching something that has the virus on it (is contaminated) and then touching your mouth, nose, or eyes. What increases the risk? Certain things may make you more likely to get the flu. These include:  Not washing your hands often.  Having close contact with many people during cold and flu season.  Touching your mouth, eyes, or nose without first washing your hands.  Not getting a flu shot every year. You may have a higher risk for the flu, along with serious problems such  as a lung infection (pneumonia), if you:  Are older than 65.  Are pregnant.  Have a weakened disease-fighting system (immune system) because of a disease or taking certain medicines.  Have a long-term (chronic) illness, such as: ? Heart, kidney, or lung disease. ? Diabetes. ? Asthma.  Have a liver disorder.  Are very overweight (morbidly obese).  Have anemia. This is a condition that affects your red blood cells. What are the signs or symptoms? Symptoms usually begin suddenly and last 4-14 days. They may include:  Fever and chills.  Headaches, body aches, or muscle aches.  Sore throat.  Cough.  Runny or stuffy (congested) nose.  Chest discomfort.  Not wanting to eat as much as normal (poor appetite).  Weakness or feeling tired (fatigue).  Dizziness.  Feeling sick to your stomach (nauseous) or throwing up (vomiting). How is this treated? If the flu is found early, you can be treated with medicine that can help reduce how bad the illness is and how long it lasts (antiviral medicine). This may be given by mouth (orally) or through an IV tube. Taking care of yourself at home can help your symptoms get better. Your doctor may suggest:  Taking over-the-counter medicines.  Drinking plenty of fluids. The flu often goes away on its own. If you have very bad symptoms or other problems, you may be treated in a hospital. Follow these instructions at home:     Activity  Rest  as needed. Get plenty of sleep.  Stay home from work or school as told by your doctor. ? Do not leave home until you do not have a fever for 24 hours without taking medicine. ? Leave home only to visit your doctor. Eating and drinking  Take an ORS (oral rehydration solution). This is a drink that is sold at pharmacies and stores.  Drink enough fluid to keep your pee (urine) pale yellow.  Drink clear fluids in small amounts as you are able. Clear fluids include: ? Water. ? Ice chips. ? Fruit  juice that has water added (diluted fruit juice). ? Low-calorie sports drinks.  Eat bland, easy-to-digest foods in small amounts as you are able. These foods include: ? Bananas. ? Applesauce. ? Rice. ? Lean meats. ? Toast. ? Crackers.  Do not eat or drink: ? Fluids that have a lot of sugar or caffeine. ? Alcohol. ? Spicy or fatty foods. General instructions  Take over-the-counter and prescription medicines only as told by your doctor.  Use a cool mist humidifier to add moisture to the air in your home. This can make it easier for you to breathe.  Cover your mouth and nose when you cough or sneeze.  Wash your hands with soap and water often, especially after you cough or sneeze. If you cannot use soap and water, use alcohol-based hand sanitizer.  Keep all follow-up visits as told by your doctor. This is important. How is this prevented?   Get a flu shot every year. You may get the flu shot in late summer, fall, or winter. Ask your doctor when you should get your flu shot.  Avoid contact with people who are sick during fall and winter (cold and flu season). Contact a doctor if:  You get new symptoms.  You have: ? Chest pain. ? Watery poop (diarrhea). ? A fever.  Your cough gets worse.  You start to have more mucus.  You feel sick to your stomach.  You throw up. Get help right away if you:  Have shortness of breath.  Have trouble breathing.  Have skin or nails that turn a bluish color.  Have very bad pain or stiffness in your neck.  Get a sudden headache.  Get sudden pain in your face or ear.  Cannot eat or drink without throwing up. Summary  Influenza ("the flu") is an infection in the lungs, nose, and throat. It is caused by a virus.  Take over-the-counter and prescription medicines only as told by your doctor.  Getting a flu shot every year is the best way to avoid getting the flu. This information is not intended to replace advice given to you by  your health care provider. Make sure you discuss any questions you have with your health care provider. Document Released: 09/24/2008 Document Revised: 06/03/2018 Document Reviewed: 06/03/2018 Elsevier Interactive Patient Education  2019 [employer].

## 2019-02-01 ENCOUNTER — Ambulatory Visit (INDEPENDENT_AMBULATORY_CARE_PROVIDER_SITE_OTHER): Payer: Self-pay | Admitting: Physician Assistant

## 2019-02-01 ENCOUNTER — Ambulatory Visit: Payer: Self-pay

## 2019-02-01 ENCOUNTER — Encounter: Payer: Self-pay | Admitting: Physician Assistant

## 2019-02-01 VITALS — BP 110/70 | HR 77 | Temp 98.1°F | Resp 18 | Ht 61.0 in | Wt 175.0 lb

## 2019-02-01 DIAGNOSIS — R142 Eructation: Secondary | ICD-10-CM

## 2019-02-01 DIAGNOSIS — R140 Abdominal distension (gaseous): Secondary | ICD-10-CM

## 2019-02-01 DIAGNOSIS — R1011 Right upper quadrant pain: Secondary | ICD-10-CM

## 2019-02-01 LAB — POCT URINALYSIS DIPSTICK
Bilirubin, UA: NEGATIVE
Blood, UA: 5
Glucose, UA: NEGATIVE
Ketones, UA: 10
Leukocytes, UA: NEGATIVE
Nitrite, UA: NEGATIVE
Protein, UA: NEGATIVE
Spec Grav, UA: 1.015 (ref 1.010–1.025)
Urobilinogen, UA: 0.2 E.U./dL
pH, UA: 6 (ref 5.0–8.0)

## 2019-02-01 LAB — POCT URINE PREGNANCY: Preg Test, Ur: NEGATIVE

## 2019-02-01 MED ORDER — ONDANSETRON HCL 4 MG PO TABS: 4.0000 mg | ORAL_TABLET | Freq: Three times a day (TID) | ORAL | 0 refills | Status: DC | PRN

## 2019-02-01 MED ORDER — SUCRALFATE 1 GM/10ML PO SUSP: 1.0000 g | Freq: Three times a day (TID) | ORAL | 0 refills | Status: DC

## 2019-02-01 MED ORDER — PANTOPRAZOLE SODIUM 40 MG PO TBEC: 40.0000 mg | DELAYED_RELEASE_TABLET | Freq: Every day | ORAL | 0 refills | Status: DC

## 2019-02-01 NOTE — Telephone Encounter (Signed)
Pt. Called to report onset of intermittent mid-upper Abdominal pain on Saturday, 2/1.  Reported the pain is below the sternum, between the right and left rib cage.  Rated the pain at 5-7/10.  The duration of pain varies.  Described as sharp.  Stated "it is not at all like abdominal cramping."  Denied any change in bowel pattern; denied diarrhea or constipation.  C/o nausea, but denied vomiting.  Stated she has some abdominal bloating.  Reported being seen at [facility] last Thursday, and advised she had the Flu, even though her test was negative.  Was treated with Tamiflu and has also been taking Advil Cold and Sinus.  Denied fever at this time.  Pt. Questioned if she was experiencing heart burn; denied any burning into her chest, or feeling indigestion.       Appt. Scheduled 2/4 at 9:20 AM.  Care advice given per protocol.  Verb. Understanding.  Agrees with plan.            Reason for Disposition . [1] MODERATE pain (e.g., interferes with normal activities) AND [2] pain comes and goes (cramps) AND [3] present > 24 hours  (Exception: pain with Vomiting or Diarrhea - see that Guideline)  Answer Assessment - Initial Assessment Questions 1. LOCATION: "Where does it hurt?"      In upper mid-abdominal area , just below the sternum and between the right and left rib cage 2. RADIATION: "Does the pain shoot anywhere else?" (e.g., chest, back)     Denied  3. ONSET: "When did the pain begin?" (e.g., minutes, hours or days ago)      Started on Saturday night 4. SUDDEN: "Gradual or sudden onset?"     sudden 5. PATTERN "Does the pain come and go, or is it constant?"    - If constant: "Is it getting better, staying the same, or worsening?"      (Note: Constant means the pain never goes away completely; most serious pain is constant and it progresses)     - If intermittent: "How long does it last?" "Do you have pain now?"     (Note: Intermittent means the pain goes away completely between bouts)     Comes and  goes 6. SEVERITY: "How bad is the pain?"  (e.g., Scale 1-10; mild, moderate, or severe)   - MILD (1-3): doesn't interfere with normal activities, abdomen soft and not tender to touch    - MODERATE (4-7): interferes with normal activities or awakens from sleep, tender to touch    - SEVERE (8-10): excruciating pain, doubled over, unable to do any normal activities      At present the pain is gone; has pain last night that lasted about 15 minutes; reported the pain woke her up from her sleep this morning @ 7/10; lasted 5 min.  7. RECURRENT SYMPTOM: "Have you ever had this type of abdominal pain before?" If so, ask: "When was the last time?" and "What happened that time?"     Denied 8. CAUSE: "What do you think is causing the abdominal pain?"     Unsure; wondered if it was indigestion 9. RELIEVING/AGGRAVATING FACTORS: "What makes it better or worse?" (e.g., movement, antacids, bowel movement)     If moving around during the pain it makes it worse  10. OTHER SYMPTOMS: "Has there been any vomiting, diarrhea, constipation, or urine problems?"       Felt nausea over past week; denied vomiting; last week had fever last Wed.; no temp. Now.  Denied localized tenderness. Denied diarrhea or constipation, passing gas per normal; Some abdominal bloating 11. PREGNANCY: "Is there any chance you are pregnant?" "When was your last menstrual period?"       Has an IUD; LMP; 2 weeks ago  Protocols used: ABDOMINAL PAIN - [facility]

## 2019-02-01 NOTE — Patient Instructions (Signed)
Thank you for choosing [facility] for your health care needs.  You have been diagnosed with right upper quadrant abdominal pain. Associated bloating and eructation (burping).  Will initiate treatment for gastritis (inflammation of the stomach lining). Similar to GERD / acid reflux.  Take medication as prescribed: Meds ordered this encounter  Medications  . pantoprazole (PROTONIX) 40 MG tablet    Sig: Take 1 tablet (40 mg total) by mouth daily.    Dispense:  14 tablet    Refill:  0    Order Specific Question:   Supervising Provider    Answer:   MILLER, BRIAN [[id number]]  . sucralfate (CARAFATE) 1 GM/10ML suspension    Sig: Take 10 mLs (1 g total) by mouth 4 (four) times daily -  with meals and at bedtime.    Dispense:  420 mL    Refill:  0    Order Specific Question:   Supervising Provider    Answer:   MILLER, BRIAN [[id number]]  . ondansetron (ZOFRAN) 4 MG tablet    Sig: Take 1 tablet (4 mg total) by mouth every 8 (eight) hours as needed for nausea or vomiting.    Dispense:  10 tablet    Refill:  0    Order Specific Question:   Supervising Provider    Answer:   Eber Hong [[id number]]   However, concerned that your symptoms are due to gallbladder disease. Recommend you keep appointment with your family physician tomorrow. At that time, your family physician may order some blood work (such as CBC, CMP, and amylase/lipase) and/or RUQ ultrasound.  If your abdominal pain becomes much more severe tonight, go directly to the ED for further evaluation/treatment.  Hope you feel better soon!  Cholecystitis  Cholecystitis is irritation and swelling (inflammation) of the gallbladder. The gallbladder is an organ that is shaped like a pear. It is under the liver on the right side of the body. This organ stores bile. Bile helps the body break down (digest) the fats in food. This condition can occur all of a sudden. It needs to be treated. What are the causes? This condition may be caused by stones or  lumps that form in the gallbladder (gallstones). Gallstones can block the tube (duct) that carries bile out of your gallbladder. Other causes are:  Damage to the gallbladder due to less blood flow.  Germs in the bile ducts.  Scars or kinks in the bile ducts.  Abnormal growths (tumors) in the liver, pancreas, or gallbladder. What increases the risk? You are more likely to develop this condition if:  You have sickle cell disease.  You take birth control pills.  You use estrogen.  You have alcoholic liver disease.  You have liver cirrhosis.  You are being fed through a vein.  You are very ill.  You do not eat or drink for a long time. This is also called "fasting."  You are overweight (obese).  You lose weight too fast.  You are pregnant.  You have high levels of fat in the blood (triglycerides).  You have irritation and swelling of the pancreas (pancreatitis). What are the signs or symptoms? Symptoms of this condition include:  Pain in the belly (abdomen). Pain is often in the upper right area of the belly.  Tenderness or bloating in the belly.  Feeling sick to your stomach (nauseous).  Throwing up (vomiting).  Fever.  Chills. How is this diagnosed? This condition may be diagnosed with a medical history and exam. You may also  have other tests, such as:  Imaging tests. This may include: ? Ultrasound. ? CT scan of the belly. ? Nuclear scan. This is also called a HIDA scan. This scan lets your doctor see the bile as it moves in your body. ? MRI.  Blood tests. These are done to check: ? Your blood count. The white blood cell count may be higher than normal. ? How well your liver works. How is this treated? This condition may be treated with:  Surgery to take out your gallbladder.  Antibiotic medicines to treat illnesses caused by germs.  Going without food for some time.  Giving fluids through an IV tube.  Medicines to treat pain or throwing  up. Follow these instructions at home:  If you had surgery, follow instructions from your doctor about how to care for yourself after you go home. Medicines   Take over-the-counter and prescription medicines only as told by your doctor.  If you were prescribed an antibiotic medicine, take it as told by your doctor. Do not stop taking it even if you start to feel better. General instructions  Follow instructions from your doctor about what to eat or drink. Do not eat or drink anything that makes you sick again.  Do not lift anything that is heavier than 10 lb (4.5 kg) until your doctor says that it is safe.  Do not use any products that contain nicotine or tobacco, such as cigarettes and e-cigarettes. If you need help quitting, ask your doctor.  Keep all follow-up visits as told by your doctor. This is important. Contact a doctor if:  You have pain and your medicine does not help.  You have a fever. Get help right away if:  Your pain moves to: ? Another part of your belly. ? Your back.  Your symptoms do not go away.  You have new symptoms. Summary  Cholecystitis is swelling and irritation of the gallbladder.  This condition may be caused by stones or lumps that form in the gallbladder (gallstones).  Common symptoms are pain in the belly. You may feel sick to your stomach and start throwing up. You may also have a fever and chills.  This condition may be treated with surgery to take out the gallbladder. It may also be treated with medicines, fasting, and fluids through an IV tube.  Follow what you are told about eating and drinking. Do not eat things that make you sick again. This information is not intended to replace advice given to you by your health care provider. Make sure you discuss any questions you have with your health care provider. Document Released: 12/05/2011 Document Revised: 04/24/2018 Document Reviewed: 04/24/2018 Elsevier Interactive Patient Education   2019 [employer].

## 2019-02-01 NOTE — Progress Notes (Signed)
Patient ID: [name] DOB: [birth year] AGE: 33 y.o. MRN: [id number]   PCP: Emi Belfast, FNP   Chief Complaint:  Chief Complaint  Patient presents with  . Abdominal Pain    x2d     Subjective:    HPI:  Anita Stanley is a 33 y.o. female presents for evaluation  Chief Complaint  Patient presents with  . Abdominal Pain    x13d   33 year old female presents to [facility] with three day history of abdominal pain/discomfort. Located in a band across upper abdomen; RUQ, epigastric area, and LUQ. On and off. When it occurs, describes as burning pain. Random. Slightly relieved with eating. Will wake patient up from sleeping. Associated bloating/distended sensation. Describes as gassy sensation. Though patient reports she has gained 20lbs since quitting smoking in July 2019. Patient also reports associated nausea and eructation/burping. Denies fever, chills, headache, vomiting, diarrhea, constipation, vaginal rash/discharge/pruritis, urinary symptoms. Patient states she has never had heartburn previously. Not even when pregnant. Last pregnancy 11 years ago. Patient currently with Mirena IUD, last period 2 weeks ago.  Patient denies family history of gallbladder disease.  Patient seen at [facility] in [place] on Thursday 01/28/2019, 4 days ago, with flu like symptoms x 1 day. Reported fever, sore throat, and nasal congestion. Negative rapid strep and rapid flu test. Provider suspected diagnosis of influenza despite negative flu test. Prescribed Tamiflu and Cepacol throat lozenges. Patient never filled Tamiflu. Patient does report she has been taking Advil Cold & Sinus every four hours regularly since flu like symptoms began. Patient states she has been feeling better in regards to flu-like symptoms; lingering nasal congestion. Patient denies recent episode of drinking copious alcohol, does not currently smoke or chew tobacco, no recent spicy meals.  Patient states she  believes mild nausea may have started weeks ago. Patient three weeks ago attended a fundraising dinner. Ate Tilapia, does not normally eat fish. States she had mild nausea since then.  A limited review of symptoms was performed, pertinent positives and negatives as mentioned in HPI.  The following portions of the patient's history were reviewed and updated as appropriate: allergies, current medications and past medical history.  There are no active problems to display for this patient.   Allergies  Allergen Reactions  . Morphine And Related     Current Outpatient Medications on File Prior to Visit  Medication Sig Dispense Refill  . levonorgestrel (MIRENA) 20 MCG/24HR IUD 1 each by Intrauterine route once.    . Benzocaine-Menthol (CEPACOL SORE THROAT) 15-3.6 MG LOZG Use as directed 1 lozenge in the mouth or throat every 4 (four) hours as needed. (Patient not taking: Reported on 02/01/2019) 18 each 0  . oseltamivir (TAMIFLU) 75 MG capsule Take 1 capsule (75 mg total) by mouth 2 (two) times daily. (Patient not taking: Reported on 02/01/2019) 10 capsule 0   No current facility-administered medications on file prior to visit.        Objective:   Vitals:   02/01/19 1432  BP: 110/70  Pulse: 77  Resp: 18  Temp: 98.1 F (36.7 C)  SpO2: 98%     Wt Readings from Last 3 Encounters:  02/01/19 175 lb (79.4 kg)  01/28/19 175 lb 12.8 oz (79.7 kg)  10/20/18 170 lb (77.1 kg)    Physical Exam:   General Appearance:  Patient sitting comfortably on examination table. Conversational. Peri Jefferson self-historian. In no acute distress. Afebrile.   Head:  Normocephalic, without obvious abnormality, atraumatic  Eyes:  PERRL, conjunctiva/corneas clear, EOM's intact  Ears:  Bilateral ear canals WNL. No erythema or edema. No discharge/drainage. Bilateral TMs WNL. No erythema, injection, or serous effusion. No scar tissue.  Nose: Nares normal, septum midline. Nasal mucosa with bilateral edema and scant clear  rhinorrhea. Nasally sounding voice. No sinus tenderness with percussion/palpation.  Throat: Lips, mucosa, and tongue normal; teeth and gums normal. Throat reveals no erythema. Tonsils with no enlargement or exudate.  Neck: Supple, symmetrical, trachea midline, no adenopathy  Lungs:   Clear to auscultation bilaterally, respirations unlabored  Heart:  Regular rate and rhythm, S1 and S2 normal, no murmur, rub, or gallop  Abdomen:   Abdomen normal to inspection. No gross deformity. No erythema. No ecchymosis. No rash. Mild tenderness in epigastric area. Moderate tenderness with palpation in right upper quadrant area, especially with deep inspiration. However, negative Murphy's sign (no halt of inspiration).  Extremities: Extremities normal, atraumatic, no cyanosis or edema  Pulses: 2+ and symmetric  Skin: Skin color, texture, turgor normal, no rashes or lesions  Lymph nodes: Cervical, supraclavicular, and axillary nodes normal  Neurologic: Normal    Assessment & Plan:    Exam findings, diagnosis etiology and medication use and indications reviewed with patient. Follow-Up and discharge instructions provided. No emergent/urgent issues found on exam.  Patient education was provided.   Patient verbalized understanding of information provided and agrees with plan of care (POC), all questions answered. The patient is advised to call or return to clinic if condition does not see an improvement in symptoms, or to seek the care of the closest emergency department if condition worsens with the below plan.    Urinalysis performed in clinic today normal with exception to 5rbc/ul and ketones 10mg /dl. Otherwise negative. Urine HCG negative.   1. Right upper quadrant pain - POCT Urinalysis Dipstick  2. Bloating - POCT urine pregnancy  3. Eructation - pantoprazole (PROTONIX) 40 MG tablet; Take 1 tablet (40 mg total) by mouth daily.  Dispense: 14 tablet; Refill: 0 - sucralfate (CARAFATE) 1 GM/10ML  suspension; Take 10 mLs (1 g total) by mouth 4 (four) times daily -  with meals and at bedtime.  Dispense: 420 mL; Refill: 0 - ondansetron (ZOFRAN) 4 MG tablet; Take 1 tablet (4 mg total) by mouth every 8 (eight) hours as needed for nausea or vomiting.  Dispense: 10 tablet; Refill: 0  Patient with three day history of epigastric abdominal discomfort. Associated nausea, abdominal bloating, and eructation. Recent increase in Advil consumption due to flu-like symptoms 4 days ago. Suspect patient has gastritis. However, RUQ tenderness on PE. Patient overweight female in her 30s; possibility of gallbladder disease. Patient has appointment with PCP tomorrow. Will treat patient with Zofran, PPI, and carafate. Advised patient keep appointment tomorrow with PCP for further evaluation; may need blood work (CBC, CMP, amylase/lipase, etc) and/or RUQ abdominal ultrasound. Patient afebrile, VSS, in no acute distress, non acute abdomen on PE. Advised patient go directly to the ED tonight if symptoms worsen. Patient agreed with plan.    Janalyn Harder, MHS, PA-C Rulon Sera, MHS, PA-C Advanced Practice Provider [facility]  [address], [facility], 1st Floor [place], [place] [address] (p):  [phone] [email or web address]

## 2019-02-02 ENCOUNTER — Ambulatory Visit (INDEPENDENT_AMBULATORY_CARE_PROVIDER_SITE_OTHER): Payer: No Typology Code available for payment source | Admitting: Primary Care

## 2019-02-02 ENCOUNTER — Encounter: Payer: Self-pay | Admitting: Primary Care

## 2019-02-02 ENCOUNTER — Other Ambulatory Visit (INDEPENDENT_AMBULATORY_CARE_PROVIDER_SITE_OTHER): Payer: No Typology Code available for payment source

## 2019-02-02 ENCOUNTER — Ambulatory Visit
Admission: RE | Admit: 2019-02-02 | Discharge: 2019-02-02 | Disposition: A | Discharge status: Home / Self Care | Payer: No Typology Code available for payment source | Source: Ambulatory Visit | Attending: Primary Care | Admitting: Primary Care

## 2019-02-02 VITALS — BP 118/72 | HR 72 | Temp 98.2°F | Ht 61.0 in | Wt 176.0 lb

## 2019-02-02 DIAGNOSIS — R100 Acute abdomen: Secondary | ICD-10-CM

## 2019-02-02 DIAGNOSIS — R1010 Upper abdominal pain, unspecified: Secondary | ICD-10-CM | POA: Insufficient documentation

## 2019-02-02 LAB — COMPREHENSIVE METABOLIC PANEL
ALT: 19 U/L (ref 0–35)
AST: 14 U/L (ref 0–37)
Albumin: 4.4 g/dL (ref 3.5–5.2)
Alkaline Phosphatase: 57 U/L (ref 39–117)
BUN: 14 mg/dL (ref 6–23)
CO2: 29 mEq/L (ref 19–32)
Calcium: 9 mg/dL (ref 8.4–10.5)
Chloride: 103 mEq/L (ref 96–112)
Creatinine, Ser: 0.53 mg/dL (ref 0.40–1.20)
GFR: 132.9 mL/min (ref >60.00)
Glucose, Bld: 93 mg/dL (ref 70–99)
Potassium: 3.7 mEq/L (ref 3.5–5.1)
Sodium: 139 mEq/L (ref 135–145)
Total Bilirubin: 0.5 mg/dL (ref 0.2–1.2)
Total Protein: 6.6 g/dL (ref 6.0–8.3)

## 2019-02-02 LAB — CBC WITH DIFFERENTIAL/PLATELET
Basophils Absolute: 0 10*3/uL (ref 0.0–0.1)
Basophils Relative: 0.4 % (ref 0.0–3.0)
Eosinophils Absolute: 0.2 10*3/uL (ref 0.0–0.7)
Eosinophils Relative: 2.7 % (ref 0.0–5.0)
HCT: 37.5 % (ref 36.0–46.0)
Hemoglobin: 12.8 g/dL (ref 12.0–15.0)
Lymphocytes Relative: 25.4 % (ref 12.0–46.0)
Lymphs Abs: 2.1 10*3/uL (ref 0.7–4.0)
MCHC: 34 g/dL (ref 30.0–36.0)
MCV: 90.8 fl (ref 78.0–100.0)
Monocytes Absolute: 0.7 10*3/uL (ref 0.1–1.0)
Monocytes Relative: 8.1 % (ref 3.0–12.0)
Neutro Abs: 5.2 10*3/uL (ref 1.4–7.7)
Neutrophils Relative %: 63.4 % (ref 43.0–77.0)
Platelets: 208 10*3/uL (ref 150.0–400.0)
RBC: 4.13 Mil/uL (ref 3.87–5.11)
RDW: 12 % (ref 11.5–15.5)
WBC: 8.1 10*3/uL (ref 4.0–10.5)

## 2019-02-02 LAB — HCG, QUANTITATIVE, PREGNANCY: Quantitative HCG: <0.6 m[IU]/mL

## 2019-02-02 LAB — LIPASE: Lipase: 11 U/L (ref 11.0–59.0)

## 2019-02-02 NOTE — Patient Instructions (Addendum)
Stop by the lab prior to leaving today. I will notify you of your results once received.   Stop by the front desk and speak with either Shirlee Limerick or Zella Ball regarding your ultrasound.  Do not take the pantoprazole medication until you've returned the stool specimen.  Do not take any additional Advil, Ibuprofen, Aleve, naproxen, Motrin.  It was a pleasure to see you today!

## 2019-02-02 NOTE — Progress Notes (Signed)
Subjective:    Patient ID: Anita Stanley, female    DOB: 1986-07-18, 33 y.o.   MRN: [id number]  HPI Anita Stanley is a 33 year old female who presents today with a chief complaint of abdominal pain.  She was evaluated at [facility] in [place] for a three day history of bilateral upper abdominal pain that had been intermittent. She described her pain as burning, improved with eating. Also with nausea, belching. She quit smoking in July 2019. UA and urine pregnancy at urgent care were negative. She was provided with prescriptions for pantoprazole 40 mg, sucralfate suspension, and ondansetron. Of note, she endorsed an increase intake of Advil over the last several days due to flu like symptoms. She was also treated with Tamiflu for influenza, did test negative for flu in the clinic.  Today she continues to experience abdominal pain to the epigastric and left upper abdomen. Also with breast tenderness. She's since taken one dose of Tamilflu and stopped as it made her feel bad. She has not had Advil in 24 hours. She denies nausea over the last 24 hours, had felt nausea for the prior three weeks. She denies esophageal reflux and belching, diarrhea, bloody stools, fevers. She ate around 12 am this morning and felt worse.   Review of Systems  Constitutional: Negative for fever.  HENT: Negative for congestion and sore throat.   Respiratory: Negative for cough.   Gastrointestinal: Positive for abdominal pain and nausea. Negative for diarrhea and vomiting.  Genitourinary: Negative for dysuria.       No past medical history on file.   Social History   Socioeconomic History  . Marital status: Married    Spouse name: Not on file  . Number of children: Not on file  . Years of education: Not on file  . Highest education level: Not on file  Occupational History  . Not on file  Social Needs  . Financial resource strain: Not on file  . Food insecurity:    Worry: Not on file    Inability: Not on  file  . Transportation needs:    Medical: Not on file    Non-medical: Not on file  Tobacco Use  . Smoking status: Current Every Day Smoker  . Smokeless tobacco: Never Used  Substance and Sexual Activity  . Alcohol use: Yes    Comment: occ  . Drug use: Not Currently    Types: MDMA (Ecstacy)  . Sexual activity: Yes    Partners: Male  Lifestyle  . Physical activity:    Days per week: Not on file    Minutes per session: Not on file  . Stress: Not on file  Relationships  . Social connections:    Talks on phone: Not on file    Gets together: Not on file    Attends religious service: Not on file    Active member of club or organization: Not on file    Attends meetings of clubs or organizations: Not on file    Relationship status: Not on file  . Intimate partner violence:    Fear of current or ex partner: Not on file    Emotionally abused: Not on file    Physically abused: Not on file    Forced sexual activity: Not on file  Other Topics Concern  . Not on file  Social History Narrative   Lives with husband, son and daughter.    Works at [employer] as Psychologist, sport and exercise. In nursing school.  Enjoys shopping, travel.     No past surgical history on file.  Family History  Problem Relation Age of Onset  . Diabetes Mother   . Diabetes Father   . Alcohol abuse Sister     Allergies  Allergen Reactions  . Morphine And Related     Current Outpatient Medications on File Prior to Visit  Medication Sig Dispense Refill  . levonorgestrel (MIRENA) 20 MCG/24HR IUD 1 each by Intrauterine route once.    . ondansetron (ZOFRAN) 4 MG tablet Take 1 tablet (4 mg total) by mouth every 8 (eight) hours as needed for nausea or vomiting. 10 tablet 0  . pantoprazole (PROTONIX) 40 MG tablet Take 1 tablet (40 mg total) by mouth daily. 14 tablet 0  . sucralfate (CARAFATE) 1 GM/10ML suspension Take 10 mLs (1 g total) by mouth 4 (four) times daily -  with meals and at bedtime. 420 mL 0  . Benzocaine-Menthol  (CEPACOL SORE THROAT) 15-3.6 MG LOZG Use as directed 1 lozenge in the mouth or throat every 4 (four) hours as needed. (Patient not taking: Reported on 02/01/2019) 18 each 0   No current facility-administered medications on file prior to visit.     BP 118/72   Pulse 72   Temp 98.2 F (36.8 C) (Oral)   Ht 5\' 1"  (1.549 m)   Wt 176 lb (79.8 kg)   LMP 01/20/2019   SpO2 98%   BMI 33.25 kg/m    Objective:   Physical Exam  Constitutional: She appears well-nourished. She does not appear ill.  HENT:  Right Ear: Tympanic membrane and ear canal normal.  Left Ear: Tympanic membrane and ear canal normal.  Nose: No mucosal edema. Right sinus exhibits no maxillary sinus tenderness and no frontal sinus tenderness. Left sinus exhibits no maxillary sinus tenderness and no frontal sinus tenderness.  Mouth/Throat: Oropharynx is clear and moist.  Neck: Neck supple.  Cardiovascular: Normal rate and regular rhythm.  Respiratory: Effort normal and breath sounds normal. She has no wheezes.  GI: Soft. Bowel sounds are normal. There is no abdominal tenderness.  Skin: Skin is warm and dry.           Assessment & Plan:

## 2019-02-02 NOTE — Assessment & Plan Note (Signed)
Present for the last 4 days. Evaluated in urgent care, negative UA and negative urine pregnancy test.   Exam today overall unremarkable.  She does appear uncomfortable. Differential diagnosis include cholelithiasis/cholecystitis, pregnancy, peptic ulcer disease, GERD, H. Pylori. Labs pending for differentials above. Ultrasound of the upper abdomen obtained and is negative for acute process.  We will await lab testing and continue her current prescribed medications from urgent care. She does appear stable for outpatient treatment.

## 2019-02-03 ENCOUNTER — Other Ambulatory Visit: Payer: No Typology Code available for payment source

## 2019-02-03 DIAGNOSIS — R100 Acute abdomen: Secondary | ICD-10-CM

## 2019-02-03 NOTE — Addendum Note (Signed)
Addended by: Alvina Chou on: 02/03/2019 12:00 PM   Modules accepted: Orders

## 2019-02-04 LAB — HELICOBACTER PYLORI  SPECIAL ANTIGEN
MICRO NUMBER:: 154709
SPECIMEN QUALITY: ADEQUATE

## 2019-03-16 ENCOUNTER — Other Ambulatory Visit: Payer: Self-pay

## 2019-03-16 ENCOUNTER — Encounter: Payer: Self-pay | Admitting: Primary Care

## 2019-03-16 ENCOUNTER — Ambulatory Visit (INDEPENDENT_AMBULATORY_CARE_PROVIDER_SITE_OTHER): Payer: No Typology Code available for payment source | Admitting: Primary Care

## 2019-03-16 VITALS — BP 118/76 | HR 83 | Temp 98.2°F | Ht 61.0 in | Wt 176.8 lb

## 2019-03-16 DIAGNOSIS — Z01419 Encounter for gynecological examination (general) (routine) without abnormal findings: Secondary | ICD-10-CM | POA: Diagnosis not present

## 2019-03-16 DIAGNOSIS — R350 Frequency of micturition: Secondary | ICD-10-CM | POA: Diagnosis not present

## 2019-03-16 LAB — POC URINALSYSI DIPSTICK (AUTOMATED)
Glucose, UA: NEGATIVE
Ketones, UA: NEGATIVE
Nitrite, UA: POSITIVE
Protein, UA: NEGATIVE
Spec Grav, UA: >=1.03 — AB (ref 1.010–1.025)
Urobilinogen, UA: 0.2 E.U./dL
pH, UA: 5.5 (ref 5.0–8.0)

## 2019-03-16 MED ORDER — SULFAMETHOXAZOLE-TRIMETHOPRIM 800-160 MG PO TABS: 1.0000 | ORAL_TABLET | Freq: Two times a day (BID) | ORAL | 0 refills | Status: DC

## 2019-03-16 MED ORDER — PHENAZOPYRIDINE HCL 200 MG PO TABS: 200.0000 mg | ORAL_TABLET | Freq: Three times a day (TID) | ORAL | 0 refills | Status: DC | PRN

## 2019-03-16 NOTE — Progress Notes (Signed)
Subjective:    Patient ID: Anita Stanley, female    DOB: March 09, 1986, 33 y.o.   MRN: [id number]  HPI  Anita Stanley is a 33 year old female who presents today with a chief complaint of back pain. She is also needing a referral to GYN to have her IUD removed.   She also reports urinary frequency, non radiating bilateral lower back pain. She was evaluated through an e-visit for symptoms of urinary frequency, suprapubic pressure, lower back pain. She was diagnosed with UTI at that time and treated with a five day course of Macrobid.  After her visit she had some improvement in symptoms but over the last two days she's noticed an increase in symptoms. She completed the entire course of Macrobid. 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   She's been taking AZO over the last 12 hours due to symptoms with some/temporary improvement. She denies hematuria, vaginal itching, vaginal discharge, fevers. She did begin her menstrual cycle today.  Review of Systems  Constitutional: Negative for fever.  Gastrointestinal: Negative for abdominal pain and nausea.  Genitourinary: Positive for dysuria, frequency and urgency. Negative for flank pain and vaginal discharge.       No past medical history on file.   Social History   Socioeconomic History  . Marital status: Married    Spouse name: Not on file  . Number of children: Not on file  . Years of education: Not on file  . Highest education level: Not on file  Occupational History  . Not on file  Social Needs  . Financial resource strain: Not on file  . Food insecurity:    Worry: Not on file    Inability: Not on file  . Transportation needs:    Medical: Not on file    Non-medical: Not on file  Tobacco Use  . Smoking status: Current Every Day Smoker  . Smokeless tobacco: Never Used  Substance and Sexual Activity  . Alcohol use: Yes    Comment: occ  . Drug use:  Not Currently    Types: MDMA (Ecstacy)  . Sexual activity: Yes    Partners: Male  Lifestyle  . Physical activity:    Days per week: Not on file    Minutes per session: Not on file  . Stress: Not on file  Relationships  . Social connections:    Talks on phone: Not on file    Gets together: Not on file    Attends religious service: Not on file    Active member of club or organization: Not on file    Attends meetings of clubs or organizations: Not on file    Relationship status: Not on file  . Intimate partner violence:    Fear of current or ex partner: Not on file    Emotionally abused: Not on file    Physically abused: Not on file    Forced sexual activity: Not on file  Other Topics Concern  . Not on file  Social History Narrative   Lives with husband, son and daughter.    Works at [employer] as Psychologist, sport and exercise. In nursing school.    Enjoys shopping, travel.     No past surgical history on file.  Family History  Problem Relation Age of Onset  . Diabetes Mother   . Diabetes Father   . Alcohol abuse Sister     Allergies  Allergen Reactions  . Morphine And Related     Current Outpatient Medications on File Prior to Visit  Medication Sig Dispense Refill  . Benzocaine-Menthol (CEPACOL SORE THROAT) 15-3.6 MG LOZG Use as directed 1 lozenge in the mouth or throat every 4 (four) hours as needed. 18 each 0  . levonorgestrel (MIRENA) 20 MCG/24HR IUD 1 each by Intrauterine route once.    . ondansetron (ZOFRAN) 4 MG tablet Take 1 tablet (4 mg total) by mouth every 8 (eight) hours as needed for nausea or vomiting. 10 tablet 0  . pantoprazole (PROTONIX) 40 MG tablet Take 1 tablet (40 mg total) by mouth daily. 14 tablet 0  . sucralfate (CARAFATE) 1 GM/10ML suspension Take 10 mLs (1 g total) by mouth 4 (four) times daily -  with meals and at bedtime. 420 mL 0   No current facility-administered medications on file prior to visit.     BP 118/76   Pulse 83   Temp 98.2 F (36.8 C) (Oral)    Ht 5\' 1"  (1.549 m)   Wt 176 lb 12 oz (80.2 kg)   LMP 03/16/2019   SpO2 96%   BMI 33.40 kg/m    Objective:   Physical Exam  Constitutional: She appears well-nourished.  Neck: Neck supple.  Cardiovascular: Normal rate and regular rhythm.  Respiratory: Effort normal and breath sounds normal.  GI: Soft. Bowel sounds are normal. There is no abdominal tenderness. There is no CVA tenderness.  Skin: Skin is warm and dry.           Assessment & Plan:  Urinary Frequency:  Also with suprapubic pressure and non radiating lower back pain. Exam not consistent for renal stone, other abdominal process. UA today with 2+ leuks, positive nitrites, trace blood. Culture sent. Given level of discomfort will treat while we await culture results. Rx for Bactrim and Pyridium course sent to pharmacy.  Discussed to push intake of water.  Doreene Nest, NP

## 2019-03-16 NOTE — Patient Instructions (Signed)
Start Bactrim DS (sulfamethoxazole/trimethoprim) tablets for urinary tract infection. Take 1 tablet by mouth twice daily for 5 days.  You can take the pyridium every 8 hours as needed for symptoms of frequency, burning, urgency.  Please call me if no improvement in 2-3 days.  Ensure you are consuming 64 ounces of water daily.  It was a pleasure to see you today!

## 2019-03-18 LAB — URINE CULTURE
MICRO NUMBER:: 328433
SPECIMEN QUALITY:: ADEQUATE

## 2019-03-23 ENCOUNTER — Other Ambulatory Visit: Payer: Self-pay

## 2019-03-23 ENCOUNTER — Other Ambulatory Visit (HOSPITAL_COMMUNITY)
Admission: RE | Admit: 2019-03-23 | Discharge: 2019-03-23 | Disposition: A | Discharge status: Home / Self Care | Payer: No Typology Code available for payment source | Source: Ambulatory Visit | Attending: Obstetrics and Gynecology | Admitting: Obstetrics and Gynecology

## 2019-03-23 ENCOUNTER — Ambulatory Visit (INDEPENDENT_AMBULATORY_CARE_PROVIDER_SITE_OTHER): Payer: No Typology Code available for payment source | Admitting: Obstetrics and Gynecology

## 2019-03-23 ENCOUNTER — Encounter: Payer: Self-pay | Admitting: Obstetrics and Gynecology

## 2019-03-23 VITALS — BP 104/70 | HR 71 | Ht 59.0 in | Wt 175.0 lb

## 2019-03-23 DIAGNOSIS — Z1151 Encounter for screening for human papillomavirus (HPV): Secondary | ICD-10-CM | POA: Diagnosis not present

## 2019-03-23 DIAGNOSIS — R350 Frequency of micturition: Secondary | ICD-10-CM | POA: Diagnosis not present

## 2019-03-23 DIAGNOSIS — Z30433 Encounter for removal and reinsertion of intrauterine contraceptive device: Secondary | ICD-10-CM

## 2019-03-23 DIAGNOSIS — Z124 Encounter for screening for malignant neoplasm of cervix: Secondary | ICD-10-CM

## 2019-03-23 LAB — POCT URINALYSIS DIPSTICK
Bilirubin, UA: NEGATIVE
Blood, UA: NEGATIVE
Glucose, UA: NEGATIVE
Ketones, UA: NEGATIVE
Leukocytes, UA: NEGATIVE
Nitrite, UA: NEGATIVE
Protein, UA: NEGATIVE
Spec Grav, UA: 1.015 (ref 1.010–1.025)
pH, UA: 5 (ref 5.0–8.0)

## 2019-03-23 MED ORDER — LEVONORGESTREL 20 MCG/24HR IU IUD: 1.0000 | INTRAUTERINE_SYSTEM | Freq: Once | INTRAUTERINE | 0 refills | Status: DC

## 2019-03-23 NOTE — Progress Notes (Signed)
PCP:  Emi Belfast, FNP   Chief Complaint  Patient presents with  . Contraception    IUD removal and would like reinsertion  . Urinary Tract Infection    urinary frequency and lower back pain     HPI:      [name] is a 33 y.o. No obstetric history on file. who LMP was Patient's last menstrual period was 03/16/2019 (exact date)., presents today for NP> 3 yrs IUD removal and replacement. Also past due for her pap smear.  Her menses are regular every 28-30 days, lasting 4 days, light spotting.  Dysmenorrhea none. She does not have intermenstrual bleeding.  Sex activity: single partner, contraception - IUD. Mirena placed 06/07/13. Past due for removal. Pt had neg UPT 2/20 and just had last menses that was normal. Wants another one. Last Pap: June 07, 2013 in chart, may have had one 4/17 with PCP.  Results were: no abnormalities   There is no FH of breast cancer. There is no FH of ovarian cancer.  Tobacco use: The patient denies current or previous tobacco use. Alcohol use: none No drug use.   Pt treated for UTI with pos C&S last wk with Bactrim. Still having urinary frequency with and without good flow, suprapubic pressure, LBP. No hematuria, dysuria, fevers. Hx of pyelonephritis during pregnancies.  Past Medical History:  Diagnosis Date  . Pyelonephritis   . UTI (urinary tract infection)     Past Surgical History:  Procedure Laterality Date  . WRIST SURGERY  2012   fx    Family History  Problem Relation Age of Onset  . Diabetes Mellitus II Mother   . Diabetes Mellitus II Father   . Alcohol abuse Sister   . Diabetes Mellitus II Maternal Grandmother   . Diabetes Mellitus II Maternal Grandfather   . Diabetes Mellitus II Paternal Grandmother   . Diabetes Mellitus II Paternal Grandfather     Social History   Socioeconomic History  . Marital status: Married    Spouse name: Not on file  . Number of children: Not on file  . Years of education: Not on  file  . Highest education level: Not on file  Occupational History  . Not on file  Social Needs  . Financial resource strain: Not on file  . Food insecurity:    Worry: Not on file    Inability: Not on file  . Transportation needs:    Medical: Not on file    Non-medical: Not on file  Tobacco Use  . Smoking status: Current Every Day Smoker  . Smokeless tobacco: Never Used  Substance and Sexual Activity  . Alcohol use: Yes    Comment: occ  . Drug use: Not Currently    Types: MDMA (Ecstacy)  . Sexual activity: Yes    Partners: Male    Birth control/protection: I.U.D.    Comment: Mirena  Lifestyle  . Physical activity:    Days per week: Not on file    Minutes per session: Not on file  . Stress: Not on file  Relationships  . Social connections:    Talks on phone: Not on file    Gets together: Not on file    Attends religious service: Not on file    Active member of club or organization: Not on file    Attends meetings of clubs or organizations: Not on file    Relationship status: Not on file  . Intimate partner violence:  Fear of current or ex partner: Not on file    Emotionally abused: Not on file    Physically abused: Not on file    Forced sexual activity: Not on file  Other Topics Concern  . Not on file  Social History Narrative   Lives with husband, son and daughter.    Works at [employer] as Psychologist, sport and exercise. In nursing school.    Enjoys shopping, travel.      Current Outpatient Medications:  .  levonorgestrel (MIRENA) 20 MCG/24HR IUD, 1 Intra Uterine Device (1 each total) by Intrauterine route once for 1 dose., Disp: 1 each, Rfl: 0    ROS:  Review of Systems  Constitutional: Negative for fatigue, fever and unexpected weight change.  Respiratory: Negative for cough, shortness of breath and wheezing.   Cardiovascular: Negative for chest pain, palpitations and leg swelling.  Gastrointestinal: Negative for blood in stool, constipation, diarrhea, nausea and vomiting.   Endocrine: Negative for cold intolerance, heat intolerance and polyuria.  Genitourinary: Positive for frequency and urgency. Negative for dyspareunia, dysuria, flank pain, genital sores, hematuria, menstrual problem, pelvic pain, vaginal bleeding, vaginal discharge and vaginal pain.  Musculoskeletal: Positive for back pain. Negative for joint swelling and myalgias.  Skin: Negative for rash.  Neurological: Negative for dizziness, syncope, light-headedness, numbness and headaches.  Hematological: Negative for adenopathy.  Psychiatric/Behavioral: Negative for agitation, confusion, sleep disturbance and suicidal ideas. The patient is not nervous/anxious.     Objective: BP 104/70   Pulse 71   Ht 4\' 11"  (1.499 m)   Wt 175 lb (79.4 kg)   LMP 03/16/2019 (Exact Date)   BMI 35.35 kg/m    Physical Exam Constitutional:      General: She is not in acute distress. Genitourinary:     Vulva, vagina, cervix, uterus, right adnexa and left adnexa normal.     No vulval lesion, tenderness or ulcerations noted.     No vaginal discharge, erythema, tenderness or bleeding.     IUD strings visualized.     Uterus is not enlarged or tender.     No right or left adnexal mass present.     Right adnexa not tender.     Left adnexa not tender.  Pulmonary:     Effort: Pulmonary effort is normal.  Abdominal:     Tenderness: There is no abdominal tenderness. There is no right CVA tenderness or left CVA tenderness.  Musculoskeletal: Normal range of motion.  Neurological:     General: No focal deficit present.     Mental Status: She is alert.     Cranial Nerves: No cranial nerve deficit.  Skin:    General: Skin is warm and dry.  Psychiatric:        Mood and Affect: Mood normal.        Behavior: Behavior normal.        Thought Content: Thought content normal.        Judgment: Judgment normal.  Vitals signs and nursing note reviewed.      IUD Removal Strings of IUD identified and grasped.  IUD removed  without problem with ring forceps.  Pt tolerated this well.  IUD noted to be intact.  IUD Insertion Procedure Note Patient identified, informed consent performed, consent signed.   Discussed risks of irregular bleeding, cramping, infection, malpositioning or misplacement of the IUD outside the uterus which may require further procedure such as laparoscopy, risk of failure <1%. Time out was performed.    Speculum placed in the vagina.  Cervix visualized.  Cleaned with Betadine x 2.  Grasped anteriorly with a single tooth tenaculum.  Uterus sounded to 9.0 cm.   IUD placed per manufacturer's recommendations.  Strings trimmed to 3 cm. Tenaculum was removed, good hemostasis noted.  Patient tolerated procedure well.    Results: Results for orders placed or performed in visit on 03/23/19 (from the past 24 hour(s))  POCT Urinalysis Dipstick     Status: Normal   Collection Time: 03/23/19  3:31 PM  Result Value Ref Range   Color, UA yellow    Clarity, UA clear    Glucose, UA Negative Negative   Bilirubin, UA neg    Ketones, UA neg    Spec Grav, UA 1.015 1.010 - 1.025   Blood, UA neg    pH, UA 5.0 5.0 - 8.0   Protein, UA Negative Negative   Urobilinogen, UA     Nitrite, UA neg    Leukocytes, UA Negative Negative   Appearance     Odor      Assessment/Plan: Encounter for removal and reinsertion of intrauterine contraceptive device (IUD) - Ibup 800 mg given to pt due to cramping after insertion.  - Plan: levonorgestrel (MIRENA) 20 MCG/24HR IUD  Cervical cancer screening - Plan: Cytology - PAP  Screening for HPV (human papillomavirus) - Plan: Cytology - PAP  Urinary frequency - Neg UA. Check C&S. Will call with results. Question bladder spasm. Try AZO. F/u prn. - Plan: Urine Culture, POCT Urinalysis Dipstick  Meds ordered this encounter  Medications  . levonorgestrel (MIRENA) 20 MCG/24HR IUD    Sig: 1 Intra Uterine Device (1 each total) by Intrauterine route once for 1 dose.    Dispense:   1 each    Refill:  0    Order Specific Question:   Supervising Provider    Answer:   Nadara Mustard [[id number]]             GYN counsel adequate intake of calcium and vitamin D, diet and exercise  Patient was given post-procedure instructions.  She was advised to have backup contraception for one week.   Call if you are having increasing pain, cramps or bleeding or if you have a fever greater than 100.4 degrees F., shaking chills, nausea or vomiting. Patient was also asked to check IUD strings periodically and follow up in 4 weeks for IUD check.     F/U  Return in about 4 weeks (around 04/20/2019) for IUD f/u.  Delailah Spieth B. Tatjana Turcott, PA-C 03/23/2019 3:35 PM

## 2019-03-23 NOTE — Patient Instructions (Signed)
I value your feedback and entrusting us with your care. If you get a [facility] patient survey, I would appreciate you taking the time to let us know about your experience today. Thank you! 

## 2019-03-25 LAB — URINE CULTURE

## 2019-03-25 NOTE — Progress Notes (Signed)
Pls let pt know C&S neg. Is she feeling any better? Don't use AZO more than 3 days.

## 2019-03-26 LAB — CYTOLOGY - PAP
Diagnosis: NEGATIVE
HPV: NOT DETECTED

## 2019-04-20 ENCOUNTER — Ambulatory Visit: Payer: No Typology Code available for payment source | Admitting: Obstetrics and Gynecology

## 2019-07-14 ENCOUNTER — Other Ambulatory Visit: Payer: Self-pay | Admitting: Family Medicine

## 2019-07-14 DIAGNOSIS — Z833 Family history of diabetes mellitus: Secondary | ICD-10-CM

## 2019-07-14 DIAGNOSIS — E6609 Other obesity due to excess calories: Secondary | ICD-10-CM

## 2019-07-14 NOTE — Progress Notes (Signed)
Orders entered for CPE.

## 2019-07-16 ENCOUNTER — Other Ambulatory Visit: Payer: No Typology Code available for payment source

## 2019-07-19 ENCOUNTER — Other Ambulatory Visit: Payer: Self-pay

## 2019-07-19 ENCOUNTER — Encounter: Payer: No Typology Code available for payment source | Admitting: Family Medicine

## 2019-07-19 ENCOUNTER — Encounter: Payer: Self-pay | Admitting: Family Medicine

## 2019-07-19 ENCOUNTER — Ambulatory Visit (INDEPENDENT_AMBULATORY_CARE_PROVIDER_SITE_OTHER): Payer: No Typology Code available for payment source | Admitting: Family Medicine

## 2019-07-19 VITALS — BP 122/62 | HR 72 | Temp 98.4°F | Ht 60.5 in | Wt 161.2 lb

## 2019-07-19 DIAGNOSIS — E6609 Other obesity due to excess calories: Secondary | ICD-10-CM | POA: Diagnosis not present

## 2019-07-19 DIAGNOSIS — R8290 Unspecified abnormal findings in urine: Secondary | ICD-10-CM | POA: Diagnosis not present

## 2019-07-19 DIAGNOSIS — Z0000 Encounter for general adult medical examination without abnormal findings: Secondary | ICD-10-CM | POA: Diagnosis not present

## 2019-07-19 DIAGNOSIS — G8929 Other chronic pain: Secondary | ICD-10-CM

## 2019-07-19 DIAGNOSIS — M25572 Pain in left ankle and joints of left foot: Secondary | ICD-10-CM

## 2019-07-19 DIAGNOSIS — M25571 Pain in right ankle and joints of right foot: Secondary | ICD-10-CM | POA: Diagnosis not present

## 2019-07-19 DIAGNOSIS — R350 Frequency of micturition: Secondary | ICD-10-CM | POA: Diagnosis not present

## 2019-07-19 DIAGNOSIS — Z6830 Body mass index (BMI) 30.0-30.9, adult: Secondary | ICD-10-CM | POA: Diagnosis not present

## 2019-07-19 DIAGNOSIS — Z833 Family history of diabetes mellitus: Secondary | ICD-10-CM | POA: Diagnosis not present

## 2019-07-19 DIAGNOSIS — Z111 Encounter for screening for respiratory tuberculosis: Secondary | ICD-10-CM | POA: Diagnosis not present

## 2019-07-19 LAB — LIPID PANEL
Cholesterol: 155 mg/dL (ref 0–200)
HDL: 46.6 mg/dL (ref >39.00)
LDL Cholesterol: 77 mg/dL (ref 0–99)
NonHDL: 108.46
Total CHOL/HDL Ratio: 3
Triglycerides: 157 mg/dL — ABNORMAL HIGH (ref 0.0–149.0)
VLDL: 31.4 mg/dL (ref 0.0–40.0)

## 2019-07-19 LAB — POC URINALSYSI DIPSTICK (AUTOMATED)
Bilirubin, UA: NEGATIVE
Glucose, UA: NEGATIVE
Ketones, UA: NEGATIVE
Leukocytes, UA: NEGATIVE
Nitrite, UA: NEGATIVE
Protein, UA: POSITIVE — AB
Spec Grav, UA: 1.025 (ref 1.010–1.025)
Urobilinogen, UA: 0.2 E.U./dL
pH, UA: 6 (ref 5.0–8.0)

## 2019-07-19 LAB — HEMOGLOBIN A1C: Hgb A1c MFr Bld: 5.5 % (ref 4.6–6.5)

## 2019-07-19 NOTE — Progress Notes (Signed)
Subjective:    Patient ID: Anita Stanley, female    DOB: 1986/10/17, 33 y.o.   MRN: [id number]  HPI This is a 33 yo female who presents today for CPE. 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 Classes will be starting back soon. Has 3 semesters to go.     Last CPE- Pap- 3/20 Tdap- UTD- had through [facility] Flu-annual Eye-regular Dental-regular Exercise-walking Diet- has cut out soda, eating better, less stress while out of school.   Past Medical History:  Diagnosis Date  . Pyelonephritis   . UTI (urinary tract infection)    Past Surgical History:  Procedure Laterality Date  . WRIST SURGERY  2012   fx   Family History  Problem Relation Age of Onset  . Diabetes Mellitus II Mother   . Diabetes Mellitus II Father   . Alcohol abuse Sister   . Diabetes Mellitus II Maternal Grandmother   . Diabetes Mellitus II Maternal Grandfather   . Diabetes Mellitus II Paternal Grandmother   . Diabetes Mellitus II Paternal Grandfather    Social History   Tobacco Use  . Smoking status: Former Smoker    Quit date: 07/13/2018    Years since quitting: 1.0  . Smokeless tobacco: Never Used  Substance Use Topics  . Alcohol use: Yes    Comment: occ  . Drug use: Not Currently    Types: MDMA (Ecstacy)      Review of Systems  Constitutional: Negative.   HENT: Negative.   Eyes: Negative.   Respiratory: Negative.   Cardiovascular: Negative.   Endocrine: Negative.   Genitourinary: Positive for frequency.       Urine smells like "amonia."   Musculoskeletal:       Feet turn in, ankles hurt. Requests referral to podiatry.   Skin: Negative.   Allergic/Immunologic: Negative.   Neurological: Negative.   Hematological: Negative.   Psychiatric/Behavioral: Negative.        Objective:   Physical Exam Physical Exam  Constitutional: She is oriented to person, place, and time. She appears well-developed and well-nourished. No distress.   HENT:  Head: Normocephalic and atraumatic.  Right Ear: External ear normal.  Left Ear: External ear normal.  Nose: Nose normal.  Mouth/Throat: Oropharynx is clear and moist. No oropharyngeal exudate.  Eyes: Conjunctivae are normal. Pupils are equal, round, and reactive to light.  Neck: Normal range of motion. Neck supple. No JVD present. No thyromegaly present.  Cardiovascular: Normal rate, regular rhythm, normal heart sounds and intact distal pulses.   Pulmonary/Chest: Effort normal and breath sounds normal. Right breast exhibits no inverted nipple, no mass, no nipple discharge, no skin change and no tenderness. Left breast exhibits no inverted nipple, no mass, no nipple discharge, no skin change and no tenderness. Breasts are symmetrical.  Abdominal: Soft. Bowel sounds are normal. She exhibits no distension and no mass. There is no tenderness. There is no rebound and no guarding.  Musculoskeletal: Normal range of motion. She exhibits no edema or tenderness.  Lymphadenopathy:    She has no cervical adenopathy.  Neurological: She is alert and oriented to person, place, and time. She has normal reflexes.  Skin: Skin is warm and dry. She is not diaphoretic.  Psychiatric: She has a normal mood and affect. Her behavior is normal. Judgment and thought content normal.  Vitals reviewed.    BP 122/62 (BP Location: Left Arm, Patient Position: Sitting, Cuff Size: Normal)   Pulse  72   Temp 98.4 F (36.9 C) (Temporal)   Ht 5' 0.5" (1.537 m)   Wt 161 lb 4 oz (73.1 kg)   SpO2 97%   BMI 30.97 kg/m  Wt Readings from Last 3 Encounters:  07/19/19 161 lb 4 oz (73.1 kg)  03/23/19 175 lb (79.4 kg)  03/16/19 176 lb 12 oz (80.2 kg)   Dep[facility] 07/19/2019 06/24/2018  Decreased Interest 0 0  Down, Depressed, Hopeless 0 0  PHQ - 2 Score 0 0   Results for orders placed or performed in visit on 07/19/19  POCT Urinalysis Dipstick (Automated)  Result Value Ref Range   Color, UA yellow     Clarity, UA clear    Glucose, UA Negative Negative   Bilirubin, UA negative    Ketones, UA negative    Spec Grav, UA 1.025 1.010 - 1.025   Blood, UA +/-    pH, UA 6.0 5.0 - 8.0   Protein, UA Positive (A) Negative   Urobilinogen, UA 0.2 0.2 or 1.0 E.U./dL   Nitrite, UA negative    Leukocytes, UA Negative Negative        Assessment & Plan:  1. Annual physical exam - Discussed and encouraged healthy lifestyle choices- adequate sleep, regular exercise, stress management and healthy food choices.   2. Class 1 obesity due to excess calories with body mass index (BMI) of 30.0 to 30.9 in adult, unspecified whether serious comorbidity present - doing well with weight loss, discussed challenges once classes resume - Hemoglobin A1c - Lipid panel  3. Family history of diabetes mellitus - Hemoglobin A1c  4. Chronic pain of both ankles - Ambulatory referral to Podiatry  5. Encounter for PPD test - TB Skin Test  6. Urinary frequency - POCT Urinalysis Dipstick (Automated) - UA unremarkable  7. Malodorous urine - POCT Urinalysis Dipstick (Automated)   Olean Reeeborah Gessner, FNP-BC  [facility]  07/19/2019 10:33 AM

## 2019-07-19 NOTE — Patient Instructions (Signed)
Good to see you today!  Good luck with school, try to keep up your healthy eating habits- pack your lunch and healthy snacks. Walk for stress relief when you can.    Health Maintenance, Female Adopting a healthy lifestyle and getting preventive care are important in promoting health and wellness. Ask your health care provider about:  The right schedule for you to have regular tests and exams.  Things you can do on your own to prevent diseases and keep yourself healthy. What should I know about diet, weight, and exercise? Eat a healthy diet   Eat a diet that includes plenty of vegetables, fruits, low-fat dairy products, and lean protein.  Do not eat a lot of foods that are high in solid fats, added sugars, or sodium. Maintain a healthy weight Body mass index (BMI) is used to identify weight problems. It estimates body fat based on height and weight. Your health care provider can help determine your BMI and help you achieve or maintain a healthy weight. Get regular exercise Get regular exercise. This is one of the most important things you can do for your health. Most adults should:  Exercise for at least 150 minutes each week. The exercise should increase your heart rate and make you sweat (moderate-intensity exercise).  Do strengthening exercises at least twice a week. This is in addition to the moderate-intensity exercise.  Spend less time sitting. Even light physical activity can be beneficial. Watch cholesterol and blood lipids Have your blood tested for lipids and cholesterol at 33 years of age, then have this test every 5 years. Have your cholesterol levels checked more often if:  Your lipid or cholesterol levels are high.  You are older than 33 years of age.  You are at high risk for heart disease. What should I know about cancer screening? Depending on your health history and family history, you may need to have cancer screening at various ages. This may include screening  for:  Breast cancer.  Cervical cancer.  Colorectal cancer.  Skin cancer.  Lung cancer. What should I know about heart disease, diabetes, and high blood pressure? Blood pressure and heart disease  High blood pressure causes heart disease and increases the risk of stroke. This is more likely to develop in people who have high blood pressure readings, are of African descent, or are overweight.  Have your blood pressure checked: ? Every 3-5 years if you are 5818-33 years of age. ? Every year if you are 33 years old or older. Diabetes Have regular diabetes screenings. This checks your fasting blood sugar level. Have the screening done:  Once every three years after age 33 if you are at a normal weight and have a low risk for diabetes.  More often and at a younger age if you are overweight or have a high risk for diabetes. What should I know about preventing infection? Hepatitis B If you have a higher risk for hepatitis B, you should be screened for this virus. Talk with your health care provider to find out if you are at risk for hepatitis B infection. Hepatitis C Testing is recommended for:  Everyone born from [birth year] through 1965.  Anyone with known risk factors for hepatitis C. Sexually transmitted infections (STIs)  Get screened for STIs, including gonorrhea and chlamydia, if: ? You are sexually active and are younger than 33 years of age. ? You are older than 33 years of age and your health care provider tells you that you are  at risk for this type of infection. ? Your sexual activity has changed since you were last screened, and you are at increased risk for chlamydia or gonorrhea. Ask your health care provider if you are at risk.  Ask your health care provider about whether you are at high risk for HIV. Your health care provider may recommend a prescription medicine to help prevent HIV infection. If you choose to take medicine to prevent HIV, you should first get tested for HIV.  You should then be tested every 3 months for as long as you are taking the medicine. Pregnancy  If you are about to stop having your period (premenopausal) and you may become pregnant, seek counseling before you get pregnant.  Take 400 to 800 micrograms (mcg) of folic acid every day if you become pregnant.  Ask for birth control (contraception) if you want to prevent pregnancy. Osteoporosis and menopause Osteoporosis is a disease in which the bones lose minerals and strength with aging. This can result in bone fractures. If you are 77 years old or older, or if you are at risk for osteoporosis and fractures, ask your health care provider if you should:  Be screened for bone loss.  Take a calcium or vitamin D supplement to lower your risk of fractures.  Be given hormone replacement therapy (HRT) to treat symptoms of menopause. Follow these instructions at home: Lifestyle  Do not use any products that contain nicotine or tobacco, such as cigarettes, e-cigarettes, and chewing tobacco. If you need help quitting, ask your health care provider.  Do not use street drugs.  Do not share needles.  Ask your health care provider for help if you need support or information about quitting drugs. Alcohol use  Do not drink alcohol if: ? Your health care provider tells you not to drink. ? You are pregnant, may be pregnant, or are planning to become pregnant.  If you drink alcohol: ? Limit how much you use to 0-1 drink a day. ? Limit intake if you are breastfeeding.  Be aware of how much alcohol is in your drink. In the [place] one drink equals one 12 oz bottle of beer (355 mL), one 5 oz glass of wine (148 mL), or one 1 oz glass of hard liquor (44 mL). General instructions  Schedule regular health, dental, and eye exams.  Stay current with your vaccines.  Tell your health care provider if: ? You often feel depressed. ? You have ever been abused or do not feel safe at home. Summary   Adopting a healthy lifestyle and getting preventive care are important in promoting health and wellness.  Follow your health care provider's instructions about healthy diet, exercising, and getting tested or screened for diseases.  Follow your health care provider's instructions on monitoring your cholesterol and blood pressure. This information is not intended to replace advice given to you by your health care provider. Make sure you discuss any questions you have with your health care provider. Document Released: 07/01/2011 Document Revised: 12/09/2018 Document Reviewed: 12/09/2018 Elsevier Patient Education  2020 [employer].

## 2019-07-28 ENCOUNTER — Ambulatory Visit: Payer: No Typology Code available for payment source

## 2019-08-13 ENCOUNTER — Ambulatory Visit (INDEPENDENT_AMBULATORY_CARE_PROVIDER_SITE_OTHER): Payer: No Typology Code available for payment source

## 2019-08-13 ENCOUNTER — Other Ambulatory Visit: Payer: Self-pay

## 2019-08-13 ENCOUNTER — Encounter: Payer: Self-pay | Admitting: Podiatry

## 2019-08-13 ENCOUNTER — Ambulatory Visit (INDEPENDENT_AMBULATORY_CARE_PROVIDER_SITE_OTHER): Payer: No Typology Code available for payment source | Admitting: Podiatry

## 2019-08-13 ENCOUNTER — Other Ambulatory Visit: Payer: Self-pay | Admitting: Podiatry

## 2019-08-13 ENCOUNTER — Ambulatory Visit: Payer: No Typology Code available for payment source

## 2019-08-13 VITALS — Temp 98.2°F

## 2019-08-13 DIAGNOSIS — M722 Plantar fascial fibromatosis: Secondary | ICD-10-CM | POA: Diagnosis not present

## 2019-08-13 DIAGNOSIS — M659 Synovitis and tenosynovitis, unspecified: Secondary | ICD-10-CM | POA: Diagnosis not present

## 2019-08-13 DIAGNOSIS — M779 Enthesopathy, unspecified: Secondary | ICD-10-CM

## 2019-08-13 MED ORDER — METHYLPREDNISOLONE 4 MG PO TBPK: ORAL_TABLET | ORAL | 0 refills | Status: DC

## 2019-08-13 MED ORDER — MELOXICAM 15 MG PO TABS: 15.0000 mg | ORAL_TABLET | Freq: Every day | ORAL | 1 refills | Status: DC

## 2019-08-16 NOTE — Progress Notes (Signed)
   Subjective: 33 y.o. female presenting today as a new patient with a chief complaint of aching pain noted to the bilateral ankles and arch of the right foot that began about 3-4 weeks ago. She reports associated swelling of the ankles, right greater than left. Walking for long periods of time increases the pain. She has been taking Ibuprofen and icing the areas for treatment. Patient is here for further evaluation and treatment.   Past Medical History:  Diagnosis Date  . Pyelonephritis   . UTI (urinary tract infection)      Objective: Physical Exam General: The patient is alert and oriented x3 in no acute distress.  Dermatology: Skin is warm, dry and supple bilateral lower extremities. Negative for open lesions or macerations bilateral.   Vascular: Dorsalis Pedis and Posterior Tibial pulses palpable bilateral.  Capillary fill time is immediate to all digits.  Neurological: Epicritic and protective threshold intact bilateral.   Musculoskeletal: Tenderness to palpation to the plantar aspect of the bilateral heels along the plantar fascia as well as to the medial, lateral and anterior aspects of the right ankle joint. All other joints range of motion within normal limits bilateral. Strength 5/5 in all groups bilateral.   Radiographic exam: Normal osseous mineralization. Joint spaces preserved. No fracture/dislocation/boney destruction. No other soft tissue abnormalities or radiopaque foreign bodies.   Assessment: 1. plantar fasciitis bilateral feet 2. Ankle synovitis right   Plan of Care:  1. Patient evaluated. Xrays reviewed.   2. Injection of 0.5cc Celestone soluspan injected into the right ankle joint.  3. Rx for Medrol Dose Pak placed 4. Rx for Meloxicam ordered for patient. 5. Compression anklet dispensed.  6. Instructed patient regarding therapies and modalities at home to alleviate symptoms.  7. Appointment with Liliane Channel, Pedorthist, for custom molded orthotics.  8. Return to  clinic in 4 weeks.    Edrick Kins, DPM [facility]  Dr. Edrick Kins, DPM    [address], [place] [address]                Office [phone]  Fax [phone]

## 2019-09-01 ENCOUNTER — Ambulatory Visit: Payer: Self-pay | Admitting: Orthotics

## 2019-09-01 ENCOUNTER — Other Ambulatory Visit: Payer: Self-pay

## 2019-09-01 DIAGNOSIS — M659 Synovitis and tenosynovitis, unspecified: Secondary | ICD-10-CM

## 2019-09-01 DIAGNOSIS — M6788 Other specified disorders of synovium and tendon, other site: Secondary | ICD-10-CM

## 2019-09-01 DIAGNOSIS — M79676 Pain in unspecified toe(s): Secondary | ICD-10-CM

## 2019-09-01 NOTE — Progress Notes (Signed)
Patient will get f/o when she switches over to [facility] Choice plan in January..She will pay for Powersteps today

## 2019-09-10 ENCOUNTER — Ambulatory Visit: Payer: No Typology Code available for payment source | Admitting: Podiatry

## 2019-12-13 ENCOUNTER — Encounter: Payer: Self-pay | Admitting: Family Medicine

## 2019-12-13 ENCOUNTER — Other Ambulatory Visit: Payer: Self-pay

## 2019-12-13 ENCOUNTER — Ambulatory Visit (INDEPENDENT_AMBULATORY_CARE_PROVIDER_SITE_OTHER): Payer: No Typology Code available for payment source | Admitting: Family Medicine

## 2019-12-13 VITALS — Temp 98.3°F | Ht 60.5 in | Wt 176.0 lb

## 2019-12-13 DIAGNOSIS — B360 Pityriasis versicolor: Secondary | ICD-10-CM | POA: Diagnosis not present

## 2019-12-13 DIAGNOSIS — R5383 Other fatigue: Secondary | ICD-10-CM | POA: Diagnosis not present

## 2019-12-13 DIAGNOSIS — R635 Abnormal weight gain: Secondary | ICD-10-CM

## 2019-12-13 DIAGNOSIS — L700 Acne vulgaris: Secondary | ICD-10-CM

## 2019-12-13 MED ORDER — SELENIUM SULFIDE 2.5 % EX LOTN: 1.0000 "application " | TOPICAL_LOTION | Freq: Every day | CUTANEOUS | 12 refills | Status: DC | PRN

## 2019-12-13 MED ORDER — DOXYCYCLINE HYCLATE 100 MG PO CAPS: 100.0000 mg | ORAL_CAPSULE | Freq: Two times a day (BID) | ORAL | 0 refills | Status: DC

## 2019-12-13 NOTE — Progress Notes (Signed)
Virtual Visit via Video Note  I connected with Anita Stanley on 12/13/19 at 11:45 AM EST by a video enabled telemedicine application and verified that I am speaking with the correct person using two identifiers.  Location: Patient: In her car Provider: [place]   I discussed the limitations of evaluation and management by telemedicine and the availability of in person appointments. The patient expressed understanding and agreed to proceed.  History of Present Illness: Chief Complaint  Patient presents with  . Fatigue    Fatigue x several weeks. Wants some medication for this - maybe labs checked  . Medication Refill    Fungal Rx for fungal infection on her back - Tinea versicolor - never prescribed by Jackelyn Poling - pt states that it was similar to [employer]  . Acne    Pt c/o breakouts on On face since wearing N95 masks   This is a 33 year old female who presents for virtual visit today with the above concerns. 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  She is sleeping for 6 hours at night.  She quit smoking last year and has gained about 35 pounds.  She is concerned for abnormal labs and would like these checked.  She and her children have been trying to exercise more regularly at the gym.  Tinea versicolor-she has had this since childhood and is out of her selenium sulfide shampoo and requests refill.  She reports wearing PPE has exacerbated this. Acne-wearing N95 mask on the weekend his caused breakout on her chin.  The area is very tender to touch.  She has been using Aveeno facial cleanser twice a day.  Observations/Objective: Patient is alert and answers questions appropriately.  Respirations are even and unlabored.  There is no audible wheeze or witnessed cough.  She has multiple erythematous papules on her chin.  Mood and affect are appropriate.  Temp 98.3 F (36.8 C) (Oral)   Ht 5' 0.5" (1.537 m)   Wt 176 lb (79.8 kg)   BMI  33.81 kg/m   Assessment and Plan: 1. Fatigue, unspecified type -Unclear etiology.  Will check labs.  I have encouraged her to increase her sleep and exercise as tolerated.  We will also work on making healthier food choices and weight loss which will likely help. - CBC with Differential; Future - Comprehensive metabolic panel; Future - TSH; Future - Hemoglobin A1c; Future - Vitamin D, 25-hydroxy; Future - Ferritin; Future - B12; Future  2. Unintended weight gain -Sent her information via her MyChart regarding balanced meal planning. - CBC with Differential; Future - Comprehensive metabolic panel; Future - TSH; Future - Hemoglobin A1c; Future  3. Tinea versicolor - selenium sulfide (SELSUN) 2.5 % shampoo; Apply 1 application topically daily as needed for irritation.  Dispense: 118 mL; Refill: 12  4. Acne vulgaris -Given how inflamed her lesions are, I will send in a course of antibiotic and discussed with her using over-the-counter salicylic acid wash and benzyl peroxide to decrease bacterial counts under PPE. - doxycycline (VIBRAMYCIN) 100 MG capsule; Take 1 capsule (100 mg total) by mouth 2 (two) times daily.  Dispense: 14 capsule; Refill: 0   Clarene Reamer, FNP-BC  [facility] [place] [facility]  12/13/2019 1:31 PM   Follow Up Instructions:    I discussed the assessment and treatment plan with the patient. The patient was provided an opportunity to ask questions and all were answered. The  patient agreed with the plan and demonstrated an understanding of the instructions.   The patient was advised to call back or seek an in-person evaluation if the symptoms worsen or if the condition fails to improve as anticipated.  Alyse Low, FNP

## 2020-01-11 ENCOUNTER — Telehealth: Payer: Self-pay

## 2020-01-11 NOTE — Telephone Encounter (Signed)
Pt called for results and was tested through [facility]. Call was dropped. Sent pt message through my chart with the number to [facility].

## 2020-02-12 ENCOUNTER — Telehealth: Payer: No Typology Code available for payment source | Admitting: Nurse Practitioner

## 2020-02-12 DIAGNOSIS — M545 Low back pain, unspecified: Secondary | ICD-10-CM

## 2020-02-12 DIAGNOSIS — R300 Dysuria: Secondary | ICD-10-CM

## 2020-02-12 NOTE — Progress Notes (Signed)
Based on what you shared with me it looks like you have urinary symptoms with back pain,that should be evaluated in a face to face office visit. Due tp the associating back pain, you will need to have a urinalysis and urine culture done for proper treatment.    NOTE: If you entered your credit card information for this eVisit, you will not be charged. You may see a "hold" on your card for the $35 but that hold will drop off and you will not have a charge processed.  If you are having a true medical emergency please call 911.     For an urgent face to face visit, [facility] has four urgent care centers for your convenience:   . [facility]    [phone]                  Get Driving Directions  [address] [place], [place] [address] . 10 am to 8 pm Monday-Friday . 12 pm to 8 pm Saturday-Sunday   . [facility] at [facility]  [phone]                  Get Driving Directions  [address] [place], [place] [address] . 8 am to 8 pm Monday-Friday . 9 am to 6 pm Saturday . 11 am to 6 pm Sunday   . [facility] at [facility]  [phone]                  Get Driving Directions   [address] [place], [place] [address] . 8 am to 8 pm Monday-Friday . 8 am to 4 pm Saturday-Sunday    . [facility] at [facility] Directions  [phone]  [address] [place], [place] [address]  . Monday-Friday, 12 PM to 6 PM    Your e-visit answers were reviewed by a board certified advanced clinical practitioner to complete your personal care plan.  Thank you for using e-Visits.

## 2020-02-16 ENCOUNTER — Telehealth: Payer: No Typology Code available for payment source | Admitting: Family

## 2020-02-16 DIAGNOSIS — R109 Unspecified abdominal pain: Secondary | ICD-10-CM

## 2020-02-16 DIAGNOSIS — R399 Unspecified symptoms and signs involving the genitourinary system: Secondary | ICD-10-CM

## 2020-02-16 NOTE — Progress Notes (Signed)
Based on what you shared with me, I feel your condition warrants further evaluation and I recommend that you be seen for a face to face office visit. ° °Given your symptoms of a UTI with back pain you need to be seen face to face to rule out a more serious infection.  °  °NOTE: If you entered your credit card information for this eVisit, you will not be charged. You may see a "hold" on your card for the $35 but that hold will drop off and you will not have a charge processed. °  °If you are having a true medical emergency please call 911.   °  ° For an urgent face to face visit, [facility] has five urgent care centers for your convenience:  °  °  °NEW:  [facility] [place] °Get Driving Directions °[phone] °[address] °[place], [place] [address] °• 10 am - 6pm Monday - Friday °  ° [facility]) °Get Driving Directions °[phone] °[address] °[place], [place] [address] °• 10 am to 8 pm Monday-Friday °• 12 pm to 8 pm Saturday-Sunday °  °  °[facility] °Get Driving Directions °[phone] °[address] °[place], [place] [address] °• 8 am to 8 pm Monday-Friday °• 9 am to 6 pm Saturday °• 11 am to 6 pm Sunday °  °  °[facility] [place] °Get Driving Directions  °[phone] °[address] °[address] °[place], [place] [address] °• 8 am to 8 pm Monday-Friday °• 8 am to 4 pm Saturday-Sunday °  °[facility] [place] °Get Driving Directions °[phone] °[address] °[place],  [address] °• 12 pm to 6 pm Monday-Friday  °  °  °Your e-visit answers were reviewed by a board certified advanced clinical practitioner to complete your personal care plan.  Thank you for using e-Visits. °  ° ° °

## 2020-02-28 ENCOUNTER — Encounter: Payer: Self-pay | Admitting: Family Medicine

## 2020-02-28 ENCOUNTER — Ambulatory Visit
Admission: EM | Admit: 2020-02-28 | Discharge: 2020-02-28 | Disposition: A | Discharge status: Home / Self Care | Payer: No Typology Code available for payment source | Attending: Family Medicine | Admitting: Family Medicine

## 2020-02-28 DIAGNOSIS — H6001 Abscess of right external ear: Secondary | ICD-10-CM | POA: Diagnosis not present

## 2020-02-28 MED ORDER — IBUPROFEN 800 MG PO TABS: 800.0000 mg | ORAL_TABLET | Freq: Three times a day (TID) | ORAL | 0 refills | Status: DC

## 2020-02-28 MED ORDER — KETOROLAC TROMETHAMINE 60 MG/2ML IM SOLN
60.0000 mg | Freq: Once | INTRAMUSCULAR | Status: AC
  Administered 2020-02-28: 60 mg via INTRAMUSCULAR

## 2020-02-28 MED ORDER — ACETAMINOPHEN 500 MG PO TABS: 500.0000 mg | ORAL_TABLET | Freq: Four times a day (QID) | ORAL | 0 refills | Status: DC | PRN

## 2020-02-28 MED ORDER — SULFAMETHOXAZOLE-TRIMETHOPRIM 800-160 MG PO TABS: 1.0000 | ORAL_TABLET | Freq: Two times a day (BID) | ORAL | 0 refills | Status: AC

## 2020-02-28 NOTE — ED Provider Notes (Signed)
Anita Stanley    CSN: [id number] Arrival date & time: 02/28/20  1219      History   Chief Complaint Chief Complaint  Patient presents with  . Otalgia    right    HPI Anita Stanley is a 34 y.o. female.   Patient is a 34 year old female who presents today with right ear pain.  This is been present and worsening over the last 3 days.  History of MRSA and previously has had to have abscess I&D in the ear canal.  Reporting pain, pressure, dizziness and decreased hearing.  Denies any discharge or drainage from the ear.  Denies any fever, chills, bodies or night sweats.  Has been taking Tylenol and ibuprofen without much relief of the pain.  Denies any injuries to the ear.  ROS per HPI    Otalgia   Past Medical History:  Diagnosis Date  . Pyelonephritis   . UTI (urinary tract infection)     Patient Active Problem List   Diagnosis Date Noted  . Tinea versicolor 12/13/2019  . Pain of upper abdomen 02/02/2019    Past Surgical History:  Procedure Laterality Date  . WRIST SURGERY  2012   fx    OB History    Gravida  2   Para  2   Term  2   Preterm      AB      Living  2     SAB      TAB      Ectopic      Multiple      Live Births  2            Home Medications    Prior to Admission medications   Medication Sig Start Date End Date Taking? Authorizing Provider  levonorgestrel (MIRENA) 20 MCG/24HR IUD 1 Intra Uterine Device (1 each total) by Intrauterine route once for 1 dose. 03/23/19 02/28/20 Yes Copland, Ilona Sorrel, PA-C  selenium sulfide (SELSUN) 2.5 % shampoo Apply 1 application topically daily as needed for irritation. 12/13/19  Yes Emi Belfast, FNP  acetaminophen (TYLENOL) 500 MG tablet Take 1 tablet (500 mg total) by mouth every 6 (six) hours as needed. 02/28/20   Dahlia Byes A, NP  ibuprofen (ADVIL) 800 MG tablet Take 1 tablet (800 mg total) by mouth 3 (three) times daily. 02/28/20   Dahlia Byes A, NP    sulfamethoxazole-trimethoprim (BACTRIM DS) 800-160 MG tablet Take 1 tablet by mouth 2 (two) times daily for 7 days. 02/28/20 03/06/20  Janace Aris, NP    Family History Family History  Problem Relation Age of Onset  . Diabetes Mellitus II Mother   . Diabetes Mellitus II Father   . Alcohol abuse Sister   . Diabetes Mellitus II Maternal Grandmother   . Diabetes Mellitus II Maternal Grandfather   . Diabetes Mellitus II Paternal Grandmother   . Diabetes Mellitus II Paternal Grandfather     Social History Social History   Tobacco Use  . Smoking status: Former Smoker    Quit date: 07/13/2018    Years since quitting: 1.6  . Smokeless tobacco: Never Used  Substance Use Topics  . Alcohol use: Yes    Comment: occ  . Drug use: Not Currently    Types: MDMA (Ecstacy)     Allergies   Morphine and related   Review of Systems Review of Systems  HENT: Positive for ear pain.      Physical Exam Triage Vital Signs  ED Triage Vitals  Enc Vitals Group     BP 02/28/20 1230 103/64     Pulse Rate 02/28/20 1230 86     Resp --      Temp 02/28/20 1230 97.9 F (36.6 C)     Temp Source 02/28/20 1230 Oral     SpO2 02/28/20 1230 96 %     Weight 02/28/20 1228 170 lb (77.1 kg)     Height 02/28/20 1228 4\' 11"  (1.499 m)     Head Circumference --      Peak Flow --      Pain Score 02/28/20 1228 4     Pain Loc --      Pain Edu? --      Excl. in GC? --    No data found.  Updated Vital Signs BP 103/64 (BP Location: Left Arm)   Pulse 86   Temp 97.9 F (36.6 C) (Oral)   Ht 4\' 11"  (1.499 m)   Wt 170 lb (77.1 kg)   SpO2 96%   BMI 34.34 kg/m   Visual Acuity Right Eye Distance:   Left Eye Distance:   Bilateral Distance:    Right Eye Near:   Left Eye Near:    Bilateral Near:     Physical Exam Vitals and nursing note reviewed.  Constitutional:      General: She is not in acute distress.    Appearance: Normal appearance. She is not ill-appearing, toxic-appearing or diaphoretic.   HENT:     Head: Normocephalic.     Right Ear: Tympanic membrane normal. No decreased hearing noted. Swelling and tenderness present. No laceration or drainage. No foreign body. No mastoid tenderness.     Left Ear: Tympanic membrane and ear canal normal.     Ears:     Comments: TTP of tragus with swelling and erythema to inner tragus.     Nose: Nose normal.  Eyes:     Conjunctiva/sclera: Conjunctivae normal.  Pulmonary:     Effort: Pulmonary effort is normal.  Musculoskeletal:        General: Normal range of motion.     Cervical back: Normal range of motion.  Skin:    General: Skin is warm and dry.     Findings: No rash.  Neurological:     Mental Status: She is alert.  Psychiatric:        Mood and Affect: Mood normal.      UC Treatments / Results  Labs (all labs ordered are listed, but only abnormal results are displayed) Labs Reviewed - No data to display  EKG   Radiology No results found.  Procedures Procedures (including critical care time)  Medications Ordered in UC Medications  ketorolac (TORADOL) injection 60 mg (60 mg Intramuscular Given 02/28/20 1253)    Initial Impression / Assessment and Plan / UC Course  I have reviewed the triage vital signs and the nursing notes.  Pertinent labs & imaging results that were available during my care of the patient were reviewed by me and considered in my medical decision making (see chart for details).     Ear abscess to tragus- most likely based on pain, swelling and erythema to the area.  Treating with bactrim and will have her do warm compresses.  Toradol given here for the pain.  ibuprofen and tylenol at home for pain.  ENT follow up as needed.   Final Clinical Impressions(s) / UC Diagnoses   Final diagnoses:  Abscess of right ear canal  Discharge Instructions     Take the antibiotic as prescribed Toradol given here for pain  Warm compresses to the ear.  Follow up with ENT for any worsening issues.      ED Prescriptions    Medication Sig Dispense Auth. Provider   sulfamethoxazole-trimethoprim (BACTRIM DS) 800-160 MG tablet Take 1 tablet by mouth 2 (two) times daily for 7 days. 14 tablet Jabril Pursell A, NP   ibuprofen (ADVIL) 800 MG tablet Take 1 tablet (800 mg total) by mouth 3 (three) times daily. 21 tablet Sascha Baugher A, NP   acetaminophen (TYLENOL) 500 MG tablet Take 1 tablet (500 mg total) by mouth every 6 (six) hours as needed. 30 tablet Loura Halt A, NP     PDMP not reviewed this encounter.   Loura Halt A, NP 02/28/20 1308

## 2020-02-28 NOTE — ED Triage Notes (Signed)
Pt presents with c/o ear pain in the right ear for 3 days. She states she does have a history of a MRSA infection in one of her ears, she can't remember which one. She is concerned about recurrence. She reports pain, pressure, diziiness and decreased hearing. She denies any discharge/drainage from the ear. She denies f/n/v.

## 2020-02-28 NOTE — Discharge Instructions (Signed)
Take the antibiotic as prescribed Toradol given here for pain  Warm compresses to the ear.  Follow up with ENT for any worsening issues.

## 2020-03-20 ENCOUNTER — Telehealth: Payer: Self-pay | Admitting: Family Medicine

## 2020-03-20 NOTE — Telephone Encounter (Signed)
Patient is requesting a call back   She stated that she was seen virtually by Eunice Blase for breakouts on her face. Patient said that she was prescribed medication for this but the break out has returned. Patient asked for a call back first because she thought maybe a virtual would not be needed.   Patient also asked if a virtual needed to be done for appt around 4:30  Please advise

## 2020-03-20 NOTE — Telephone Encounter (Signed)
Please advise, thanks.

## 2020-03-20 NOTE — Telephone Encounter (Signed)
Please call patient and let her know that since it was 12/20 since I saw her for inflamed acne lesions, I would prefer doing at least a virtual visit, especially since she was just on antibiotics earlier this month for her ear canal abscess. She can be added at end of day today if needed.

## 2020-06-03 ENCOUNTER — Telehealth: Payer: No Typology Code available for payment source | Admitting: Nurse Practitioner

## 2020-06-03 DIAGNOSIS — M545 Low back pain, unspecified: Secondary | ICD-10-CM

## 2020-06-03 MED ORDER — CYCLOBENZAPRINE HCL 10 MG PO TABS: 10.0000 mg | ORAL_TABLET | Freq: Three times a day (TID) | ORAL | 1 refills | Status: DC | PRN

## 2020-06-03 MED ORDER — NAPROXEN 500 MG PO TABS: 500.0000 mg | ORAL_TABLET | Freq: Two times a day (BID) | ORAL | 1 refills | Status: DC

## 2020-06-03 NOTE — Progress Notes (Signed)

## 2020-06-09 ENCOUNTER — Other Ambulatory Visit: Payer: Self-pay

## 2020-06-09 ENCOUNTER — Ambulatory Visit (INDEPENDENT_AMBULATORY_CARE_PROVIDER_SITE_OTHER): Payer: No Typology Code available for payment source | Admitting: Family Medicine

## 2020-06-09 ENCOUNTER — Encounter: Payer: Self-pay | Admitting: Family Medicine

## 2020-06-09 VITALS — BP 94/60 | HR 63 | Temp 97.7°F | Ht 59.0 in | Wt 205.4 lb

## 2020-06-09 DIAGNOSIS — M722 Plantar fascial fibromatosis: Secondary | ICD-10-CM

## 2020-06-09 DIAGNOSIS — H938X3 Other specified disorders of ear, bilateral: Secondary | ICD-10-CM | POA: Diagnosis not present

## 2020-06-09 DIAGNOSIS — R635 Abnormal weight gain: Secondary | ICD-10-CM

## 2020-06-09 DIAGNOSIS — R5383 Other fatigue: Secondary | ICD-10-CM | POA: Diagnosis not present

## 2020-06-09 LAB — COMPREHENSIVE METABOLIC PANEL
ALT: 26 U/L (ref 0–35)
AST: 20 U/L (ref 0–37)
Albumin: 4.2 g/dL (ref 3.5–5.2)
Alkaline Phosphatase: 71 U/L (ref 39–117)
BUN: 15 mg/dL (ref 6–23)
CO2: 25 mEq/L (ref 19–32)
Calcium: 8.8 mg/dL (ref 8.4–10.5)
Chloride: 108 mEq/L (ref 96–112)
Creatinine, Ser: 0.59 mg/dL (ref 0.40–1.20)
GFR: 116.48 mL/min (ref >60.00)
Glucose, Bld: 108 mg/dL — ABNORMAL HIGH (ref 70–99)
Potassium: 4 mEq/L (ref 3.5–5.1)
Sodium: 138 mEq/L (ref 135–145)
Total Bilirubin: 0.2 mg/dL (ref 0.2–1.2)
Total Protein: 6.4 g/dL (ref 6.0–8.3)

## 2020-06-09 LAB — CBC WITH DIFFERENTIAL/PLATELET
Basophils Absolute: 0 10*3/uL (ref 0.0–0.1)
Basophils Relative: 0.6 % (ref 0.0–3.0)
Eosinophils Absolute: 0.2 10*3/uL (ref 0.0–0.7)
Eosinophils Relative: 3.4 % (ref 0.0–5.0)
HCT: 38.3 % (ref 36.0–46.0)
Hemoglobin: 13 g/dL (ref 12.0–15.0)
Lymphocytes Relative: 27.5 % (ref 12.0–46.0)
Lymphs Abs: 1.8 10*3/uL (ref 0.7–4.0)
MCHC: 33.9 g/dL (ref 30.0–36.0)
MCV: 91.2 fl (ref 78.0–100.0)
Monocytes Absolute: 0.6 10*3/uL (ref 0.1–1.0)
Monocytes Relative: 9.2 % (ref 3.0–12.0)
Neutro Abs: 3.8 10*3/uL (ref 1.4–7.7)
Neutrophils Relative %: 59.3 % (ref 43.0–77.0)
Platelets: 215 10*3/uL (ref 150.0–400.0)
RBC: 4.2 Mil/uL (ref 3.87–5.11)
RDW: 13.2 % (ref 11.5–15.5)
WBC: 6.4 10*3/uL (ref 4.0–10.5)

## 2020-06-09 LAB — TSH: TSH: 1.86 u[IU]/mL (ref 0.35–4.50)

## 2020-06-09 LAB — VITAMIN B12: Vitamin B-12: 941 pg/mL — ABNORMAL HIGH (ref 211–911)

## 2020-06-09 LAB — VITAMIN D 25 HYDROXY (VIT D DEFICIENCY, FRACTURES): VITD: 41.28 ng/mL (ref 30.00–100.00)

## 2020-06-09 LAB — FERRITIN: Ferritin: 52.1 ng/mL (ref 10.0–291.0)

## 2020-06-09 MED ORDER — MELOXICAM 15 MG PO TABS: 15.0000 mg | ORAL_TABLET | Freq: Every day | ORAL | 0 refills | Status: DC

## 2020-06-09 NOTE — Progress Notes (Signed)
Subjective:    Patient ID: Anita Stanley, female    DOB: October 02, 1986, 34 y.o.   MRN: [id number]  HPI Chief Complaint  Patient presents with  . Ear Problem    ears feel clogged.   . Joint Swelling    swelling and pain in both ankles/feet x 1 month. Denies SOB   Ears feel clogged x several weeks, no associated   Was seen by podiatry 8/21. Diagnosed with bilateral plantar fasciitis. Was prescribed meloxicam, prednisone, given custom orthotics which she used for about 1 week and didn't find helpful. Recently went to [employer] and had her feet analysed and has new shoes for work and exercise.   Weight gain- Breakfast- eggs, egg mcmuffin sandwich, protein shake (fair life), Lunch- salad or fast food sandwich, Dinner- grilling meat, veggies. Snacks- trail mix, chips. Drinking water, diet soda, caramel iced coffee.  Sleep- works third shift on weekends, going to school. 4-6  Hours of sleep a night. Doing boot camp 4 days a week.  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     Review of Systems    per HPI Objective:   Physical Exam Vitals reviewed.  Constitutional:      General: She is not in acute distress.    Appearance: Normal appearance. She is obese. She is not ill-appearing, toxic-appearing or diaphoretic.  HENT:     Head: Normocephalic and atraumatic.     Right Ear: Tympanic membrane, ear canal and external ear normal.     Left Ear: Tympanic membrane, ear canal and external ear normal.     Nose: Nose normal.     Mouth/Throat:     Mouth: Mucous membranes are moist.     Pharynx: Oropharynx is clear.  Eyes:     Conjunctiva/sclera: Conjunctivae normal.  Cardiovascular:     Rate and Rhythm: Normal rate and regular rhythm.     Heart sounds: Normal heart sounds.  Musculoskeletal:        General: Tenderness (bilateral soles of feet) present.     Cervical back: Normal range of motion and neck supple. No rigidity or tenderness.  Lymphadenopathy:      Cervical: No cervical adenopathy.  Skin:    General: Skin is warm and dry.  Neurological:     Mental Status: She is alert and oriented to person, place, and time.  Psychiatric:        Mood and Affect: Mood normal.        Behavior: Behavior normal.        Thought Content: Thought content normal.        Judgment: Judgment normal.       BP 94/60 (BP Location: Left Arm, Patient Position: Sitting, Cuff Size: Normal)   Pulse 63   Temp 97.7 F (36.5 C) (Temporal)   Ht 4\' 11"  (1.499 m)   Wt 205 lb 6.4 oz (93.2 kg)   SpO2 97%   BMI 41.49 kg/m  Wt Readings from Last 3 Encounters:  06/09/20 205 lb 6.4 oz (93.2 kg)  02/28/20 170 lb (77.1 kg)  12/13/19 176 lb (79.8 kg)       Assessment & Plan:  1. Fatigue, unspecified type - suspect multifactorial due to increased stress, inadequate sleep, strenuous exercise, shift work. Discussed with patient.  - CBC with Differential - Comprehensive metabolic panel - T55 - Ferritin - Vitamin D, 25-hydroxy - TSH  2. Unintended weight gain - discussed eliminating soda, sugar sweetened coffee -  CBC with Differential - Comprehensive metabolic panel - TSH  3. Plantar fasciitis, bilateral - discussed proper footwear, orthotics/ insoles, stretching, ice, NSAIDs - reviewed medication, not to take NSAIDS together - meloxicam (MOBIC) 15 MG tablet; Take 1 tablet (15 mg total) by mouth daily.  Dispense: 30 tablet; Refill: 0  4. Pressure sensation in both ears - normal on exam, avoid cotton swabs, follow up if worsening/ no improvement  - follow up in 2 months  This visit occurred during the SARS-CoV-2 public health emergency.  Safety protocols were in place, including screening questions prior to the visit, additional usage of staff PPE, and extensive cleaning of exam room while observing appropriate contact time as indicated for disinfecting solutions.      Olean Ree, FNP-BC  [facility]  06/09/2020 5:11 PM

## 2020-06-09 NOTE — Patient Instructions (Signed)
Good to see you today  Try to sleep a little more each night, continue exercising several days a week  Continue ice, new shoes, stretching for feet- I have sent in meloxicam, do not take additional NSAIDs within 24 hours  Follow up in 2 months

## 2020-06-15 ENCOUNTER — Encounter: Payer: Self-pay | Admitting: Family Medicine

## 2020-06-19 ENCOUNTER — Other Ambulatory Visit: Payer: Self-pay | Admitting: Family Medicine

## 2020-06-19 DIAGNOSIS — B360 Pityriasis versicolor: Secondary | ICD-10-CM

## 2020-06-19 MED ORDER — SELENIUM SULFIDE 2.5 % EX LOTN: 1.0000 "application " | TOPICAL_LOTION | Freq: Every day | CUTANEOUS | 12 refills | Status: DC | PRN

## 2020-06-19 MED ORDER — FLUCONAZOLE 150 MG PO TABS: 150.0000 mg | ORAL_TABLET | Freq: Once | ORAL | 0 refills | Status: AC

## 2020-07-06 ENCOUNTER — Other Ambulatory Visit: Payer: Self-pay | Admitting: Family Medicine

## 2020-07-06 DIAGNOSIS — M722 Plantar fascial fibromatosis: Secondary | ICD-10-CM

## 2020-07-07 NOTE — Telephone Encounter (Signed)
Last refilled 06/09/20 #30 x 0 refills Last OV 06/09/20  Please advise, thanks.

## 2020-08-02 ENCOUNTER — Telehealth: Payer: Self-pay | Admitting: Family Medicine

## 2020-08-02 NOTE — Telephone Encounter (Signed)
Pt called wanting to get a copy of her last TB test Please let her know when ready for pick up

## 2020-08-02 NOTE — Telephone Encounter (Signed)
Advised pt her last TB test is over a year old and she will most likely need another one. Pt asked to schedule another test. Scheduled pt for 8/10. Pt verbalized understanding.

## 2020-08-08 ENCOUNTER — Ambulatory Visit (INDEPENDENT_AMBULATORY_CARE_PROVIDER_SITE_OTHER): Payer: No Typology Code available for payment source | Admitting: *Deleted

## 2020-08-08 ENCOUNTER — Other Ambulatory Visit: Payer: Self-pay

## 2020-08-08 DIAGNOSIS — Z111 Encounter for screening for respiratory tuberculosis: Secondary | ICD-10-CM

## 2020-08-08 NOTE — Progress Notes (Signed)
Per orders of Dr. Selena Batten (for Anita Stanley), injection of TB skin test given by [facility]. Patient tolerated injection well.

## 2020-08-09 ENCOUNTER — Ambulatory Visit (INDEPENDENT_AMBULATORY_CARE_PROVIDER_SITE_OTHER): Payer: No Typology Code available for payment source | Admitting: Family Medicine

## 2020-08-09 ENCOUNTER — Encounter: Payer: Self-pay | Admitting: Family Medicine

## 2020-08-09 VITALS — BP 118/64 | HR 72 | Temp 98.6°F | Ht 59.0 in | Wt 204.0 lb

## 2020-08-09 DIAGNOSIS — M79671 Pain in right foot: Secondary | ICD-10-CM | POA: Diagnosis not present

## 2020-08-09 DIAGNOSIS — Z6841 Body Mass Index (BMI) 40.0 and over, adult: Secondary | ICD-10-CM | POA: Diagnosis not present

## 2020-08-09 DIAGNOSIS — M79672 Pain in left foot: Secondary | ICD-10-CM | POA: Diagnosis not present

## 2020-08-09 NOTE — Patient Instructions (Signed)
Www.adaptyourlifeacademy.com- carb calculator  Www.thefastingmethod.com  Www.dietdoctor.com  Go by net carbs- total carbs minus fiber  80-90 ounces of water daily

## 2020-08-09 NOTE — Progress Notes (Signed)
   Subjective:    Patient ID: Anita Stanley, female    DOB: 01/06/1986, 34 y.o.   MRN: [id number]  HPI Chief Complaint  Patient presents with  . 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 Rolling frozen ice. Some improvement with new shoes  Diet- feels like it gain is related to stopping smoking, eating 3 meals a day, breakfast- [place] bacon, 2-3 hardboiled eggs, lunch- sometimes skips, salad/ fast food sandwich, dinner- grilled meat, potato, salad. Drinks water, has increased her intake. Does snack and will sometimes have sweetened beverage from coffee shop.  Review of Systems Per HPI    Objective:   Physical Exam Physical Exam  Vitals reviewed. Constitutional: Oriented to person, place, and time. Appears well-developed and well-nourished. Obese HENT:  Head: Normocephalic and atraumatic.  Eyes: Conjunctivae are normal.  Neck: Normal range of motion. Neck supple.  Cardiovascular: Normal rate.   Pulmonary/Chest: Effort normal.  Musculoskeletal: Normal range of motion.  Neurological: Alert and oriented to person, place, and time.  Psychiatric: Normal mood and affect. Behavior is normal. Judgment and thought content normal.         BP 118/64   Pulse 72   Temp 98.6 F (37 C)   Ht 4\' 11"  (1.499 m)   Wt 204 lb (92.5 kg)   SpO2 92%   BMI 41.20 kg/m  Wt Readings from Last 3 Encounters:  08/09/20 204 lb (92.5 kg)  06/09/20 205 lb 6.4 oz (93.2 kg)  02/28/20 170 lb (77.1 kg)    Assessment & Plan:  1. Class 3 severe obesity due to excess calories without serious comorbidity with body mass index (BMI) of 40.0 to 44.9 in adult [facility]) Patient Instructions  Www.adaptyourlifeacademy.com- carb calculator  Www.thefastingmethod.com  Www.dietdoctor.com  Go by net carbs- total carbs minus fiber  80-90 ounces of water daily   -Discussed her barriers to healthy eating as well as impact of high stress and an adequate sleep on  weight loss attempts -Encouraged her to start with 1 action item per week such as increasing water, making healthier breakfast choices, eliminating snacks -Provided resources -Follow-up in 6 months -Labs previously normal   2. Bilateral foot pain -Encouraged her to continue icing and stretching exercises, discussed referral to podiatry for possible injections and or orthotics. She will let me know if she is interested in this.  This visit occurred during the SARS-CoV-2 public health emergency.  Safety protocols were in place, including screening questions prior to the visit, additional usage of staff PPE, and extensive cleaning of exam room while observing appropriate contact time as indicated for disinfecting solutions.   Over 30 minutes were spent face-to-face with the patient during this encounter and >50% of that time was spent on counseling and coordination of care    [facility], FNP-BC  [facility], [facility]  08/10/2020 11:29 AM

## 2020-08-10 ENCOUNTER — Encounter: Payer: Self-pay | Admitting: Family Medicine

## 2020-08-10 DIAGNOSIS — Z6841 Body Mass Index (BMI) 40.0 and over, adult: Secondary | ICD-10-CM | POA: Insufficient documentation

## 2020-08-10 DIAGNOSIS — E669 Obesity, unspecified: Secondary | ICD-10-CM | POA: Insufficient documentation

## 2020-08-10 DIAGNOSIS — E6609 Other obesity due to excess calories: Secondary | ICD-10-CM | POA: Insufficient documentation

## 2020-08-10 LAB — TB SKIN TEST
Induration: 0 mm
TB Skin Test: NEGATIVE

## 2020-08-25 ENCOUNTER — Telehealth: Payer: No Typology Code available for payment source | Admitting: Family

## 2020-08-25 DIAGNOSIS — R109 Unspecified abdominal pain: Secondary | ICD-10-CM

## 2020-08-25 DIAGNOSIS — R399 Unspecified symptoms and signs involving the genitourinary system: Secondary | ICD-10-CM

## 2020-08-25 NOTE — Progress Notes (Signed)
Based on what you shared with me, I feel your condition warrants further evaluation and I recommend that you be seen for a face to face office visit.   Given that you are having a UTI symptoms with back pain you need to be seen face-to-face to rule out a more serious infection   NOTE: If you entered your credit card information for this eVisit, you will not be charged. You may see a "hold" on your card for the $35 but that hold will drop off and you will not have a charge processed.   If you are having a true medical emergency please call 911.      For an urgent face to face visit, [place] has five urgent care centers for your convenience:      NEW:  [facility] at [facility] Directions [phone] [address] [place], [place] [address] . 10 am - 6pm Monday - Friday    [facility]) Get Driving Directions [phone] [address] [place], [place] [address] . 10 am to 8 pm Monday-Friday . 12 pm to 8 pm [facility] Care at [facility] Get Driving Directions [phone] [address] [place], [place] [address] . 8 am to 8 pm Monday-Friday . 9 am to 6 pm Saturday . 11 am to 6 pm Sunday     [facility] at [facility] Get Driving Directions  [phone] [address] [place], [place] [address] . 8 am to 8 pm Monday-Friday . 8 am to 4 pm [facility] Urgent Care at [facility] Directions [phone] [address] [place], [place] [address] . 12 pm to 6 pm Monday-Friday      Your e-visit answers were reviewed by a board certified advanced clinical practitioner to complete your personal care plan.  Thank you for using e-Visits.

## 2020-09-20 ENCOUNTER — Ambulatory Visit (INDEPENDENT_AMBULATORY_CARE_PROVIDER_SITE_OTHER): Payer: No Typology Code available for payment source | Admitting: Obstetrics and Gynecology

## 2020-09-20 ENCOUNTER — Other Ambulatory Visit: Payer: Self-pay

## 2020-09-20 ENCOUNTER — Encounter: Payer: Self-pay | Admitting: Obstetrics and Gynecology

## 2020-09-20 VITALS — BP 96/63 | HR 76 | Ht 59.0 in | Wt 209.0 lb

## 2020-09-20 DIAGNOSIS — Z975 Presence of (intrauterine) contraceptive device: Secondary | ICD-10-CM

## 2020-09-20 DIAGNOSIS — R399 Unspecified symptoms and signs involving the genitourinary system: Secondary | ICD-10-CM

## 2020-09-20 DIAGNOSIS — N926 Irregular menstruation, unspecified: Secondary | ICD-10-CM

## 2020-09-20 DIAGNOSIS — Z713 Dietary counseling and surveillance: Secondary | ICD-10-CM

## 2020-09-20 DIAGNOSIS — Z6841 Body Mass Index (BMI) 40.0 and over, adult: Secondary | ICD-10-CM

## 2020-09-20 DIAGNOSIS — N644 Mastodynia: Secondary | ICD-10-CM

## 2020-09-20 DIAGNOSIS — Z8616 Personal history of COVID-19: Secondary | ICD-10-CM

## 2020-09-20 LAB — POCT URINE PREGNANCY: Preg Test, Ur: NEGATIVE

## 2020-09-20 NOTE — Progress Notes (Signed)
GYNECOLOGY CLINIC PROGRESS NOTE Subjective:    Chief Complaint: Breast pain - has been ongoing for several weeks. Pain is usually,  Tender when sleeping, to touch. Denies any unusual masses or discharge. Wonders if it is hormonal due to her IUD (had new one inserted last year).   Additional Complaints: 1. Urinary symptoms - symptoms noted about 2 weeks. Notes she did a tele-doc visit and was prescribed Macrobid but doesn't feel like it helped.  Still noting urinary symptoms. Notes a h/o frequent UTI's. Is using Azo.   2.  Weight issues - has been gaining weight rapidly over the past few years.  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 Has been focusing on trying to lose weight, on a low-carb diet approximately 1 month.  Had previously done a boot camp a month or 2 prior. Also reports that she had a remote trial of Phentermine which she did well on.  Still did not lose weight. She feels ideal weight is 150-160 pounds. Weight at graduation from high school was 120 pounds. History of eating disorders: none. There is a family history positive for obesity in the patient. Previous treatments for obesity include self-directed dieting and exercising. Obesity associated medical conditions: none. Obesity associated medications: none. Cardiovascular risk factors besides obesity: obesity (BMI >= 30 kg/m2).  3. Desires to establish care as she is looking for a new GYN.  Was last seen by [facility] in 2020, for IUD removal and reinsertion.   Gynecology History:  Menarche age: 32 or 73 No LMP recorded (lmp unknown). (Menstrual status: IUD). Last pap smear: 03/03/2019   OB History  Gravida Para Term Preterm AB Living  2 2 2     2   SAB TAB Ectopic Multiple Live Births          2    # Outcome Date GA Lbr Len/2nd Weight Sex Delivery Anes PTL Lv  2 Term 02/25/07    F Vag-Spont   LIV  1 Term 02/06/05    04/06/05   LIV    Past  Medical History:  Diagnosis Date  . Pyelonephritis   . UTI (urinary tract infection)     Family History  Problem Relation Age of Onset  . Diabetes Mellitus II Mother   . Diabetes Mellitus II Father   . Alcohol abuse Sister   . Diabetes Mellitus II Maternal Grandmother   . Diabetes Mellitus II Maternal Grandfather   . Diabetes Mellitus II Paternal Grandmother   . Diabetes Mellitus II Paternal Grandfather     Past Surgical History:  Procedure Laterality Date  . WRIST SURGERY  2012   fx    Social History   Socioeconomic History  . Marital status: Married    Spouse name: Not on file  . Number of children: Not on file  . Years of education: Not on file  . Highest education level: Not on file  Occupational History  . Not on file  Tobacco Use  . Smoking status: Former Smoker    Quit date: 07/13/2018    Years since quitting: 2.1  . Smokeless tobacco: Never Used  Vaping Use  . Vaping Use: Never used  Substance and Sexual Activity  . Alcohol use: Yes    Comment: occ  . Drug use: Not Currently    Types: MDMA (Ecstacy)  . Sexual activity: Yes    Partners: Male    Birth control/protection: I.U.D.  Comment: Mirena  Other Topics Concern  . Not on file  Social History Narrative   Lives with husband, son and daughter.    Works at [employer] as Psychologist, sport and exercise. In nursing school.    Enjoys shopping, travel.    Social Determinants of Health   Financial Resource Strain:   . Difficulty of Paying Living Expenses: Not on file  Food Insecurity:   . Worried About Programme researcher, broadcasting/film/video in the Last Year: Not on file  . Ran Out of Food in the Last Year: Not on file  Transportation Needs:   . Lack of Transportation (Medical): Not on file  . Lack of Transportation (Non-Medical): Not on file  Physical Activity:   . Days of Exercise per Week: Not on file  . Minutes of Exercise per Session: Not on file  Stress:   . Feeling of Stress : Not on file  Social Connections:   . Frequency of  Communication with Friends and Family: Not on file  . Frequency of Social Gatherings with Friends and Family: Not on file  . Attends Religious Services: Not on file  . Active Member of Clubs or Organizations: Not on file  . Attends Banker Meetings: Not on file  . Marital Status: Not on file  Intimate Partner Violence:   . Fear of Current or Ex-Partner: Not on file  . Emotionally Abused: Not on file  . Physically Abused: Not on file  . Sexually Abused: Not on file    Current Outpatient Medications on File Prior to Visit  Medication Sig Dispense Refill  . acetaminophen (TYLENOL) 500 MG tablet Take 1 tablet (500 mg total) by mouth every 6 (six) hours as needed. 30 tablet 0  . ibuprofen (ADVIL) 800 MG tablet Take 1 tablet (800 mg total) by mouth 3 (three) times daily. 21 tablet 0  . levonorgestrel (MIRENA) 20 MCG/24HR IUD 1 Intra Uterine Device (1 each total) by Intrauterine route once for 1 dose. 1 each 0  . meloxicam (MOBIC) 15 MG tablet TAKE 1 TABLET BY MOUTH EVERY DAY 30 tablet 0  . naproxen (NAPROSYN) 500 MG tablet Take 1 tablet (500 mg total) by mouth 2 (two) times daily with a meal. 60 tablet 1  . selenium sulfide (SELSUN) 2.5 % shampoo Apply 1 application topically daily as needed for irritation. 118 mL 12  . cyclobenzaprine (FLEXERIL) 10 MG tablet Take 1 tablet (10 mg total) by mouth 3 (three) times daily as needed for muscle spasms. (Patient not taking: Reported on 09/20/2020) 30 tablet 1   No current facility-administered medications on file prior to visit.    Allergies  Allergen Reactions  . Morphine And Related      Review of Systems Pertinent items noted in HPI and remainder of comprehensive ROS otherwise negative.    Objective:    BP 96/63   Pulse 76   Ht 4\' 11"  (1.499 m)   Wt 209 lb (94.8 kg)   LMP  (LMP Unknown)   BMI 42.21 kg/m  General appearance: alert and no distress Abdomen: non-tender, no organomegaly.  Waist circumference 41.5 in.      Assessment:     1. UTI symptoms   2. Class 3 severe obesity due to excess calories without serious comorbidity with body mass index (BMI) of 40.0 to 44.9 in adult (HCC)   3. Breast tenderness in female   4. Missed periods   5. IUD (intrauterine device) in place     Plan:  1. UTI symptoms - patient s/p treatment with Macrobid but still noting residual symptoms. Has been using Azo recently.  Will send urine culture for further evaluation.  2. Breast tenderness - unclear cause. Could be hormonal with IUD in place. Discussed home measures (cold compresses, NSAIDS, etc).  If no relief, could attempt to give short trial of estradiol to balance IUD hormones. Will r/o pregnancy with UPT.  3. Lengthy discussion had with patient regarding weight loss journey. Currently on low-carb diet. Recommended increasing protein, low carb/low-fat diet. Will refer to a nutritionist. Also encouraged for patient to begin exercising more frequently (at least 15 min 5 x weekly, or 30 min 3 x weeky).  Also discussed weight loss programs, including Noom, Weight Watchers, etc). Also discussed medication use for weight loss. Advised on different types of medications based on need and medical history. Can consider medication next visit.    A total of 30 minutes were spent face-to-face with the patient during the encounter with greater than 50% dealing with counseling and coordination of care.    Hildred Laser, MD [facility]

## 2020-09-20 NOTE — Progress Notes (Signed)
Pt present to discuss weight management. Pt stated that her pcp is concerned about her weight. Pt stated she is having uti symptoms and took AZO due to the pain. Pt also stated having breast tenderness and pain.

## 2020-09-20 NOTE — Patient Instructions (Signed)
Preventing Unhealthy Weight Gain, Adult Staying at a healthy weight is important to your overall health. When fat builds up in your body, you may become overweight or obese. Being overweight or obese increases your risk of developing certain health problems, such as heart disease, diabetes, sleeping problems, joint problems, and some types of cancer. Unhealthy weight gain is often the result of making unhealthy food choices or not getting enough exercise. You can make changes to your lifestyle to prevent obesity and stay as healthy as possible. What nutrition changes can be made?   Eat only as much as your body needs. To do this: ? Pay attention to signs that you are hungry or full. Stop eating as soon as you feel full. ? If you feel hungry, try drinking water first before eating. Drink enough water so your urine is clear or pale yellow. ? Eat smaller portions. Pay attention to portion sizes when eating out. ? Look at serving sizes on food labels. Most foods contain more than one serving per container. ? Eat the recommended number of calories for your gender and activity level. For most active people, a daily total of 2,000 calories is appropriate. If you are trying to lose weight or are not very active, you may need to eat fewer calories. Talk with your health care provider or a diet and nutrition specialist (dietitian) about how many calories you need each day.  Choose healthy foods, such as: ? Fruits and vegetables. At each meal, try to fill at least half of your plate with fruits and vegetables. ? Whole grains, such as whole-wheat bread, brown rice, and quinoa. ? Lean meats, such as chicken or fish. ? Other healthy proteins, such as beans, eggs, or tofu. ? Healthy fats, such as nuts, seeds, fatty fish, and olive oil. ? Low-fat or fat-free dairy products.  Check food labels, and avoid food and drinks that: ? Are high in calories. ? Have added sugar. ? Are high in sodium. ? Have saturated  fats or trans fats.  Cook foods in healthier ways, such as by baking, broiling, or grilling.  Make a meal plan for the week, and shop with a grocery list to help you stay on track with your purchases. Try to avoid going to the grocery store when you are hungry.  When grocery shopping, try to shop around the outside of the store first, where the fresh foods are. Doing this helps you to avoid prepackaged foods, which can be high in sugar, salt (sodium), and fat. What lifestyle changes can be made?   Exercise for 30 or more minutes on 5 or more days each week. Exercising may include brisk walking, yard work, biking, running, swimming, and team sports like basketball and soccer. Ask your health care provider which exercises are safe for you.  Do muscle-strengthening activities, such as lifting weights or using resistance bands, on 2 or more days a week.  Do not use any products that contain nicotine or tobacco, such as cigarettes and e-cigarettes. If you need help quitting, ask your health care provider.  Limit alcohol intake to no more than 1 drink a day for nonpregnant women and 2 drinks a day for men. One drink equals 12 oz of beer, 5 oz of wine, or 1 oz of hard liquor.  Try to get 7-9 hours of sleep each night. What other changes can be made?  Keep a food and activity journal to keep track of: ? What you ate and how many calories   you had. Remember to count the calories in sauces, dressings, and side dishes. ? Whether you were active, and what exercises you did. ? Your calorie, weight, and activity goals.  Check your weight regularly. Track any changes. If you notice you have gained weight, make changes to your diet or activity routine.  Avoid taking weight-loss medicines or supplements. Talk to your health care provider before starting any new medicine or supplement.  Talk to your health care provider before trying any new diet or exercise plan. Why are these changes  important? Eating healthy, staying active, and having healthy habits can help you to prevent obesity. Those changes also:  Help you manage stress and emotions.  Help you connect with friends and family.  Improve your self-esteem.  Improve your sleep.  Prevent long-term health problems. What can happen if changes are not made? Being obese or overweight can cause you to develop joint or bone problems, which can make it hard for you to stay active or do activities you enjoy. Being obese or overweight also puts stress on your heart and lungs and can lead to health problems like diabetes, heart disease, and some cancers. Where to find more information Talk with your health care provider or a dietitian about healthy eating and healthy lifestyle choices. You may also find information from:  U.S. Department of Agriculture, MyPlate: www.choosemyplate.gov  American Heart Association: www.heart.org  Centers for Disease Control and Prevention: www.cdc.gov Summary  Staying at a healthy weight is important to your overall health. It helps you to prevent certain diseases and health problems, such as heart disease, diabetes, joint problems, sleep disorders, and some types of cancer.  Being obese or overweight can cause you to develop joint or bone problems, which can make it hard for you to stay active or do activities you enjoy.  You can prevent unhealthy weight gain by eating a healthy diet, exercising regularly, not smoking, limiting alcohol, and getting enough sleep.  Talk with your health care provider or a dietitian for guidance about healthy eating and healthy lifestyle choices. This information is not intended to replace advice given to you by your health care provider. Make sure you discuss any questions you have with your health care provider. Document Revised: 12/19/2017 Document Reviewed: 01/22/2017 Elsevier Patient Education  2020 Elsevier Inc.  

## 2020-09-22 ENCOUNTER — Telehealth: Payer: Self-pay

## 2020-09-22 NOTE — Telephone Encounter (Signed)
Pt called the office asking for her test results from her urine culture. Pt was informed that it takes 4-5 days to process the urine culture. Pt was advise to check her mychart in 4-5 days and if she did not understand the results to please allow AC 48-72 hours to review the results. Pt is aware that she will get the results at the same time as [facility]

## 2020-09-24 LAB — URINE CULTURE

## 2020-09-24 MED ORDER — SULFAMETHOXAZOLE-TRIMETHOPRIM 800-160 MG PO TABS: 1.0000 | ORAL_TABLET | Freq: Two times a day (BID) | ORAL | 1 refills | Status: DC

## 2020-09-24 NOTE — Addendum Note (Signed)
Addended by: Fabian November on: 09/24/2020 09:48 AM   Modules accepted: Orders

## 2020-10-11 ENCOUNTER — Telehealth: Payer: No Typology Code available for payment source | Admitting: Family

## 2020-10-11 ENCOUNTER — Other Ambulatory Visit: Payer: Self-pay | Admitting: Family

## 2020-10-11 DIAGNOSIS — U071 COVID-19: Secondary | ICD-10-CM | POA: Diagnosis not present

## 2020-10-11 MED ORDER — ALBUTEROL SULFATE HFA 108 (90 BASE) MCG/ACT IN AERS: 2.0000 | INHALATION_SPRAY | Freq: Four times a day (QID) | RESPIRATORY_TRACT | 0 refills | Status: DC | PRN

## 2020-10-11 MED ORDER — ONDANSETRON HCL 4 MG PO TABS: 4.0000 mg | ORAL_TABLET | Freq: Three times a day (TID) | ORAL | 0 refills | Status: DC | PRN

## 2020-10-11 MED ORDER — BENZONATATE 100 MG PO CAPS: 100.0000 mg | ORAL_CAPSULE | Freq: Three times a day (TID) | ORAL | 0 refills | Status: DC | PRN

## 2020-10-11 NOTE — Addendum Note (Signed)
Addended by: Jannifer Rodney A on: 10/11/2020 07:15 PM   Modules accepted: Orders

## 2020-10-11 NOTE — Addendum Note (Signed)
Addended by: Jannifer Rodney A on: 10/11/2020 06:04 PM   Modules accepted: Orders

## 2020-10-11 NOTE — Progress Notes (Signed)
E-Visit for Corona Virus Screening  Your current symptoms could be consistent with the coronavirus.  Many health care providers can now test patients at their office but not all are.  [facility] has multiple testing sites. For information on our COVID testing locations and hours go to [email or web address]  We are enrolling you in our MyChart Home Monitoring for COVID19 . Daily you will receive a questionnaire within the MyChart website. Our COVID 19 response team will be monitoring your responses daily.  Testing Information: The COVID-19 Community Testing sites are testing BY APPOINTMENT ONLY.  You can schedule online at [email or web address]  If you do not have access to a smart phone or computer you may call [phone] for an appointment.   Additional testing sites in the Community:  . For [facility] Testing sites in [place]  https://www.cvs.com/minuteclinic/covid-19-testing  . For Pop-up testing sites in   https://covid19.ncdhhs.gov/about-covid-19/testing/find-my-testing-place/pop-testing-sites  . For [facility] https://www.[place].gov[place]/human-services/health-department/coronavirus-covid-19-info/covid-19-testing  . For [place] testing in [place] https://www.[place].gov/[place]/human-services/health-department/coronavirus-covid-19-info/covid-19-testing  . For [facility] testing in [place]   https://[facility].care/covidtesting  For  more information about community testing call [phone]   Please quarantine yourself while awaiting your test results. Please stay home for a minimum of 10 days from the first day of illness with improving symptoms and you have had 24 hours of no fever (without the use of Tylenol (Acetaminophen) Motrin (Ibuprofen) or any fever reducing medication).  Also - Do not get tested prior to returning to work because once you have had a positive test the test can stay  positive for more than a month in some cases.   You should wear a mask or cloth face covering over your nose and mouth if you must be around other people or animals, including pets (even at home). Try to stay at least 6 feet away from other people. This will protect the people around you.  Please continue good preventive care measures, including:  frequent hand-washing, avoid touching your face, cover coughs/sneezes, stay out of crowds and keep a 6 foot distance from others.  COVID-19 is a respiratory illness with symptoms that are similar to the flu. Symptoms are typically mild to moderate, but there have been cases of severe illness and death due to the virus.   The following symptoms may appear 2-14 days after exposure: . Fever . Cough . Shortness of breath or difficulty breathing . Chills . Repeated shaking with chills . Muscle pain . Headache . Sore throat . New loss of taste or smell . Fatigue . Congestion or runny nose . Nausea or vomiting . Diarrhea  Go to the nearest hospital ED for assessment if fever/cough/breathlessness are severe or illness seems like a threat to life.  It is vitally important that if you feel that you have an infection such as this virus or any other virus that you stay home and away from places where you may spread it to others.  You should avoid contact with people age 65 and older.   You can use medication such as A prescription cough medication called Tessalon Perles 100 mg. You may take 1-2 capsules every 8 hours as needed for cough and A prescription inhaler called Albuterol MDI 90 mcg /actuation 2 puffs every 4 hours as needed for shortness of breath, wheezing, cough  You may also take acetaminophen (Tylenol) as needed for fever.  Reduce your risk of any infection by using the same precautions used for avoiding the common cold or flu:  .   Wash your hands often with soap and warm water for at least 20 seconds.  If soap and water are not readily available,  use an alcohol-based hand sanitizer with at least 60% alcohol.  . If coughing or sneezing, cover your mouth and nose by coughing or sneezing into the elbow areas of your shirt or coat, into a tissue or into your sleeve (not your hands). . Avoid shaking hands with others and consider head nods or verbal greetings only. . Avoid touching your eyes, nose, or mouth with unwashed hands.  . Avoid close contact with people who are sick. . Avoid places or events with large numbers of people in one location, like concerts or sporting events. . Carefully consider travel plans you have or are making. . If you are planning any travel outside or inside the [place], visit the CDC's Travelers' Health webpage for the latest health notices. . If you have some symptoms but not all symptoms, continue to monitor at home and seek medical attention if your symptoms worsen. . If you are having a medical emergency, call 911.  HOME CARE . Only take medications as instructed by your medical team. . Drink plenty of fluids and get plenty of rest. . A steam or ultrasonic humidifier can help if you have congestion.   GET HELP RIGHT AWAY IF YOU HAVE EMERGENCY WARNING SIGNS** FOR COVID-19. If you or someone is showing any of these signs seek emergency medical care immediately. Call 911 or proceed to your closest emergency facility if: . You develop worsening high fever. . Trouble breathing . Bluish lips or face . Persistent pain or pressure in the chest . New confusion . Inability to wake or stay awake . You cough up blood. . Your symptoms become more severe  **This list is not all possible symptoms. Contact your medical provider for any symptoms that are sever or concerning to you.  MAKE SURE YOU   Understand these instructions.  Will watch your condition.  Will get help right away if you are not doing well or get worse.  Your e-visit answers were reviewed by a board certified advanced clinical practitioner to  complete your personal care plan.  Depending on the condition, your plan could have included both over the counter or prescription medications.  If there is a problem please reply once you have received a response from your provider.  Your safety is important to us.  If you have drug allergies check your prescription carefully.    You can use MyChart to ask questions about today's visit, request a non-urgent call back, or ask for a work or school excuse for 24 hours related to this e-Visit. If it has been greater than 24 hours you will need to follow up with your provider, or enter a new e-Visit to address those concerns. You will get an e-mail in the next two days asking about your experience.  I hope that your e-visit has been valuable and will speed your recovery. Thank you for using e-visits.   Approximately 5 minutes was spent documenting and reviewing patient's chart.  

## 2020-10-24 ENCOUNTER — Other Ambulatory Visit: Payer: Self-pay | Admitting: Family

## 2020-10-25 ENCOUNTER — Encounter: Payer: No Typology Code available for payment source | Admitting: Obstetrics and Gynecology

## 2020-10-26 ENCOUNTER — Ambulatory Visit: Payer: No Typology Code available for payment source | Admitting: Dietician

## 2020-10-30 ENCOUNTER — Ambulatory Visit: Payer: No Typology Code available for payment source | Admitting: Dietician

## 2020-11-07 ENCOUNTER — Encounter: Payer: Self-pay | Admitting: Dietician

## 2020-11-07 ENCOUNTER — Encounter: Payer: No Typology Code available for payment source | Attending: Family Medicine | Admitting: Dietician

## 2020-11-07 ENCOUNTER — Other Ambulatory Visit: Payer: Self-pay | Admitting: Family Medicine

## 2020-11-07 ENCOUNTER — Other Ambulatory Visit: Payer: Self-pay

## 2020-11-07 VITALS — Ht 59.0 in | Wt 197.1 lb

## 2020-11-07 DIAGNOSIS — Z6841 Body Mass Index (BMI) 40.0 and over, adult: Secondary | ICD-10-CM | POA: Diagnosis present

## 2020-11-07 DIAGNOSIS — M722 Plantar fascial fibromatosis: Secondary | ICD-10-CM

## 2020-11-07 MED ORDER — MELOXICAM 15 MG PO TABS: 15.0000 mg | ORAL_TABLET | Freq: Every day | ORAL | 0 refills | Status: DC | PRN

## 2020-11-07 NOTE — Patient Instructions (Signed)
   Include at least one serving of a healthy starch food such as whole grain bread or crackers, brown rice, sweet potato or potato with skin, beans/ peas/ or corn, or whole wheat pasta -- with each meal.   Plan to have a meal or snack every 3-5 hours during the time you are awake. During night shift at work, have light/ small meals and snacks.   Allow for occasional treats, at least once a week, to help with following a healthy eating pattern long tern. Make at least 80% of meals and snacks healthy choices.   Great job making healthy changes!

## 2020-11-07 NOTE — Progress Notes (Signed)
Medical Nutrition Therapy: Visit start time: 1115  end time: 1200  Assessment:  Diagnosis: obesity Past medical history: none significant Psychosocial issues/ stress concerns: none  Preferred learning method:  . Hands-on   Current weight: 197.1lbs  Height: 4'11" Medications, supplements: reconciled list in medical record  Progress and evaluation:   Patient reports following a 17-day diet which is primarily low carb about 1 week ago. She feels she has lost about 2lbs so far.   Has also followed other low carb diets in the past, but had difficulty with long term adherence. Weight loss followed by regain.   Works 3rd shift, 3 nights per week.   Has reduced number of snacks, sugar, and restaurant meals in the past week.  Physical activity: walking 30 minutes, 3 times a week  Dietary Intake:  Usual eating pattern includes 2 meals and 1 snacks per day. Dining out frequency: 2+ meals per week.  Breakfast: 11am or when arriving home after night shift -- warm lemon water, occ 2 hard boiled eggs and 1/2 orange or apple Snack: none Lunch:  Snack: none Supper: (during break at work) salad with chicken or chicken/ fish with veg Snack: low sugar yogurt Beverages: water, green tea with meals  Nutrition Care Education: Topics covered:  Basic nutrition: basic food groups, appropriate nutrient balance, appropriate meal and snack schedule, general nutrition guidelines    Weight control: importance of low sugar and low fat choices, portion control; effects of strict dieting on weight and weight cycling and allowing for occasional treats to promote better long-term success; advised balanced eating pattern for overall nutrition and long term success; provided guidance for minimum carbohydrate intake to meet basic needs; discussed role of physical activity  Nutritional Diagnosis:  [place]-3.3 Overweight/obesity As related to history of excess calories and inadequate physical activity.  As evidenced by  patient with current BMI of 39, making diet and lifestyle changes to promote weight loss..  Intervention:  . Instruction and discussion as noted above. . Patient is following low-carb eating pattern currently; she will plan to modify to better meet her nutritional needs.  . Established goals for additional change with direction from patient. . No follow-up scheduled at this time; patient will schedule later if needed.  Education Materials given:  . Plate Planner with food lists, sample meal pattern . Goals/ instructions   Learner/ who was taught:  . Patient   Level of understanding: Marland Kitchen Verbalizes/ demonstrates competency   Demonstrated degree of understanding via:   Teach back Learning barriers: . None  Willingness to learn/ readiness for change: . Eager, change in progress  Monitoring and Evaluation:  Dietary intake, exercise, and body weight      follow up: prn

## 2020-11-10 ENCOUNTER — Ambulatory Visit (INDEPENDENT_AMBULATORY_CARE_PROVIDER_SITE_OTHER): Payer: No Typology Code available for payment source | Admitting: Obstetrics and Gynecology

## 2020-11-10 ENCOUNTER — Other Ambulatory Visit: Payer: Self-pay

## 2020-11-10 ENCOUNTER — Encounter: Payer: Self-pay | Admitting: Obstetrics and Gynecology

## 2020-11-10 VITALS — BP 107/73 | HR 83 | Ht 59.0 in | Wt 196.7 lb

## 2020-11-10 DIAGNOSIS — Z7689 Persons encountering health services in other specified circumstances: Secondary | ICD-10-CM

## 2020-11-10 DIAGNOSIS — Z975 Presence of (intrauterine) contraceptive device: Secondary | ICD-10-CM | POA: Insufficient documentation

## 2020-11-10 DIAGNOSIS — E669 Obesity, unspecified: Secondary | ICD-10-CM

## 2020-11-10 MED ORDER — PHENTERMINE HCL 30 MG PO CAPS: 30.0000 mg | ORAL_CAPSULE | ORAL | 0 refills | Status: DC

## 2020-11-10 NOTE — Progress Notes (Signed)
    GYNECOLOGY PROGRESS NOTE  Subjective:    Patient ID: Anita Stanley, female    DOB: 24-Nov-1986, 34 y.o.   MRN: [id number]  HPI  Patient is a 34 y.o. [id number] female who presents for weight management follow up. She was last seen in September for several complaints, one of which included her desire for weight loss. At that time she had been utilizing diet and exercise in the month or 2 prior to this. She desires to attempt use of Phentermine, has used once before in the remote past.  She has seen a nutritionist earlier this month who has helped further manage her diet.    The following portions of the patient's history were reviewed and updated as appropriate: allergies, current medications, past family history, past medical history, past social history, past surgical history and problem list.  Review of Systems Pertinent items noted in HPI and remainder of comprehensive ROS otherwise negative.   Objective:    Vitals with BMI 11/10/2020 11/07/2020 09/20/2020  Height 4\' 11"  4\' 11"  4\' 11"   Weight 196 lbs 11 oz 197 lbs 2 oz 209 lbs  BMI 39.71 39.79 42.19  Systolic 107 - 96  Diastolic 73 - 63  Pulse 83 - 76    General appearance: alert and no distress Abdomen: soft, non-tender.  Waist circumference 40.5 in.    Labs:  No new labs  Assessment:  Weight management Obesity, Body mass index is 39.73 kg/m.  Plan:   1. Moderate obesity - patient has lost ~ 13 lbs in the past 2 months. To continue current regimen of diet and exercise.  2. Desires to resume Phentermine for weight loss. Notes no issues on medication in the past. The risks and benefits and side effects of medication.  Offered trial of other medications such as The risks and benefits and side effects of medication, such as Belviq (lorcarsin), Contrave (buproprion/naltrexone), Qsymia (phentermine/topiramate), and Saxenda (liraglutide) were discussed. The pros and cons of suppressing appetite and boosting metabolism is  discussed. Risks of tolerence and addiction is discussed for selected agents discussed. Use of medicine will ne short term, such as 3-4 months at a time followed by a period of time off of the medicine to avoid these risks and side effects for Adipex discussed. Goal is for loss of ~ 10-15% of her total body weight within 3-4 months. Pt to call with any negative side effects and agrees to keep follow up appts. 3. To f/u in 1 month for weight loss follow up.    A total of 15 minutes were spent face-to-face with the patient during this encounter and over half of that time dealt with counseling and coordination of care.  , MD [facility]

## 2020-11-10 NOTE — Progress Notes (Signed)
Pt present to discuss weight management. Pt last visit was 09/20/2020 weight 209 lb. Pt stated that she was doing well no problems.

## 2020-11-10 NOTE — Addendum Note (Signed)
Addended by: Fabian November on: 11/10/2020 04:22 PM   Modules accepted: Orders

## 2020-11-10 NOTE — Patient Instructions (Signed)
Phentermine tablets or capsules What is this medicine? PHENTERMINE (FEN ter meen) decreases your appetite. It is used with a reduced calorie diet and exercise to help you lose weight. This medicine may be used for other purposes; ask your health care provider or pharmacist if you have questions. COMMON BRAND NAME(S): Adipex-P, Atti-Plex P, Atti-Plex P Spansule, Fastin, Lomaira, Pro-Fast, Tara-8 What should I tell my health care provider before I take this medicine? They need to know if you have any of these conditions:  agitation or nervousness  diabetes  glaucoma  heart disease  high blood pressure  history of drug abuse or addiction  history of stroke  kidney disease  lung disease called Primary Pulmonary Hypertension (PPH)  taken an MAOI like Carbex, Eldepryl, Marplan, Nardil, or Parnate in last 14 days  taking stimulant medicines for attention disorders, weight loss, or to stay awake  thyroid disease  an unusual or allergic reaction to phentermine, other medicines, foods, dyes, or preservatives  pregnant or trying to get pregnant  breast-feeding How should I use this medicine? Take this medicine by mouth with a glass of water. Follow the directions on the prescription label. Take your medicine at regular intervals. Do not take it more often than directed. Do not stop taking except on your doctor's advice. Talk to your pediatrician regarding the use of this medicine in children. While this drug may be prescribed for children 17 years or older for selected conditions, precautions do apply. Overdosage: If you think you have taken too much of this medicine contact a poison control center or emergency room at once. NOTE: This medicine is only for you. Do not share this medicine with others. What if I miss a dose? If you miss a dose, take it as soon as you can. If it is almost time for your next dose, take only that dose. Do not take double or extra doses. What may interact  with this medicine? Do not take this medicine with any of the following medications:  MAOIs like Carbex, Eldepryl, Marplan, Nardil, and Parnate This medicine may also interact with the following medications:  alcohol  certain medicines for depression, anxiety, or psychotic disorders  certain medicines for high blood pressure  linezolid  medicines for colds or breathing difficulties like pseudoephedrine or phenylephrine  medicines for diabetes  sibutramine  stimulant medicines for attention disorders, weight loss, or to stay awake This list may not describe all possible interactions. Give your health care provider a list of all the medicines, herbs, non-prescription drugs, or dietary supplements you use. Also tell them if you smoke, drink alcohol, or use illegal drugs. Some items may interact with your medicine. What should I watch for while using this medicine? Visit your doctor or health care provider for regular checks on your progress. Do not stop taking except on your health care provider's advice. You may develop a severe reaction. Your health care provider will tell you how much medicine to take. Do not take this medicine close to bedtime. It may prevent you from sleeping. You may get drowsy or dizzy. Do not drive, use machinery, or do anything that needs mental alertness until you know how this medicine affects you. Do not stand or sit up quickly, especially if you are an older patient. This reduces the risk of dizzy or fainting spells. Alcohol may increase dizziness and drowsiness. Avoid alcoholic drinks. This medicine may affect blood sugar levels. Ask your healthcare provider if changes in diet or medicines are needed   if you have diabetes. Women should inform their health care provider if they wish to become pregnant or think they might be pregnant. Losing weight while pregnant is not advised and may cause harm to the unborn child. Talk to your health care provider for more  information. What side effects may I notice from receiving this medicine? Side effects that you should report to your doctor or health care professional as soon as possible:  allergic reactions like skin rash, itching or hives, swelling of the face, lips, or tongue  breathing problems  changes in emotions or moods  changes in vision  chest pain or chest tightness  fast, irregular heartbeat  feeling faint or lightheaded  increased blood pressure  irritable  restlessness  tremors  seizures  signs and symptoms of a stroke like changes in vision; confusion; trouble speaking or understanding; severe headaches; sudden numbness or weakness of the face, arm or leg; trouble walking; dizziness; loss of balance or coordination  unusually weak or tired Side effects that usually do not require medical attention (report to your doctor or health care professional if they continue or are bothersome):  changes in taste  constipation or diarrhea  dizziness  dry mouth  headache  trouble sleeping  upset stomach This list may not describe all possible side effects. Call your doctor for medical advice about side effects. You may report side effects to FDA at 1-800-FDA-1088. Where should I keep my medicine? Keep out of the reach of children. This medicine can be abused. Keep your medicine in a safe place to protect it from theft. Do not share this medicine with anyone. Selling or giving away this medicine is dangerous and against the law. This medicine may cause harm and death if it is taken by other adults, children, or pets. Return medicine that has not been used to an official disposal site. Contact the DEA at 1-800-882-9539 or your city/county government to find a site. If you cannot return the medicine, mix any unused medicine with a substance like cat litter or coffee grounds. Then throw the medicine away in a sealed container like a sealed bag or coffee can with a lid. Do not use the  medicine after the expiration date. Store at room temperature between 20 and 25 degrees C (68 and 77 degrees F). Keep container tightly closed. NOTE: This sheet is a summary. It may not cover all possible information. If you have questions about this medicine, talk to your doctor, pharmacist, or health care provider.  2020 Elsevier/Gold Standard (2019-10-22 12:54:20)     Preventing Unhealthy Weight Gain, Adult Staying at a healthy weight is important to your overall health. When fat builds up in your body, you may become overweight or obese. Being overweight or obese increases your risk of developing certain health problems, such as heart disease, diabetes, sleeping problems, joint problems, and some types of cancer. Unhealthy weight gain is often the result of making unhealthy food choices or not getting enough exercise. You can make changes to your lifestyle to prevent obesity and stay as healthy as possible. What nutrition changes can be made?   Eat only as much as your body needs. To do this: ? Pay attention to signs that you are hungry or full. Stop eating as soon as you feel full. ? If you feel hungry, try drinking water first before eating. Drink enough water so your urine is clear or pale yellow. ? Eat smaller portions. Pay attention to portion sizes when eating out. ?   Look at serving sizes on food labels. Most foods contain more than one serving per container. ? Eat the recommended number of calories for your gender and activity level. For most active people, a daily total of 2,000 calories is appropriate. If you are trying to lose weight or are not very active, you may need to eat fewer calories. Talk with your health care provider or a diet and nutrition specialist (dietitian) about how many calories you need each day.  Choose healthy foods, such as: ? Fruits and vegetables. At each meal, try to fill at least half of your plate with fruits and vegetables. ? Whole grains, such as  whole-wheat bread, brown rice, and quinoa. ? Lean meats, such as chicken or fish. ? Other healthy proteins, such as beans, eggs, or tofu. ? Healthy fats, such as nuts, seeds, fatty fish, and olive oil. ? Low-fat or fat-free dairy products.  Check food labels, and avoid food and drinks that: ? Are high in calories. ? Have added sugar. ? Are high in sodium. ? Have saturated fats or trans fats.  Cook foods in healthier ways, such as by baking, broiling, or grilling.  Make a meal plan for the week, and shop with a grocery list to help you stay on track with your purchases. Try to avoid going to the grocery store when you are hungry.  When grocery shopping, try to shop around the outside of the store first, where the fresh foods are. Doing this helps you to avoid prepackaged foods, which can be high in sugar, salt (sodium), and fat. What lifestyle changes can be made?   Exercise for 30 or more minutes on 5 or more days each week. Exercising may include brisk walking, yard work, biking, running, swimming, and team sports like basketball and soccer. Ask your health care provider which exercises are safe for you.  Do muscle-strengthening activities, such as lifting weights or using resistance bands, on 2 or more days a week.  Do not use any products that contain nicotine or tobacco, such as cigarettes and e-cigarettes. If you need help quitting, ask your health care provider.  Limit alcohol intake to no more than 1 drink a day for nonpregnant women and 2 drinks a day for men. One drink equals 12 oz of beer, 5 oz of wine, or 1 oz of hard liquor.  Try to get 7-9 hours of sleep each night. What other changes can be made?  Keep a food and activity journal to keep track of: ? What you ate and how many calories you had. Remember to count the calories in sauces, dressings, and side dishes. ? Whether you were active, and what exercises you did. ? Your calorie, weight, and activity goals.  Check  your weight regularly. Track any changes. If you notice you have gained weight, make changes to your diet or activity routine.  Avoid taking weight-loss medicines or supplements. Talk to your health care provider before starting any new medicine or supplement.  Talk to your health care provider before trying any new diet or exercise plan. Why are these changes important? Eating healthy, staying active, and having healthy habits can help you to prevent obesity. Those changes also:  Help you manage stress and emotions.  Help you connect with friends and family.  Improve your self-esteem.  Improve your sleep.  Prevent long-term health problems. What can happen if changes are not made? Being obese or overweight can cause you to develop joint or bone problems, which   can make it hard for you to stay active or do activities you enjoy. Being obese or overweight also puts stress on your heart and lungs and can lead to health problems like diabetes, heart disease, and some cancers. Where to find more information Talk with your health care provider or a dietitian about healthy eating and healthy lifestyle choices. You may also find information from:  U.S. Department of Agriculture, MyPlate: www.choosemyplate.gov  American Heart Association: www.heart.org  Centers for Disease Control and Prevention: www.cdc.gov Summary  Staying at a healthy weight is important to your overall health. It helps you to prevent certain diseases and health problems, such as heart disease, diabetes, joint problems, sleep disorders, and some types of cancer.  Being obese or overweight can cause you to develop joint or bone problems, which can make it hard for you to stay active or do activities you enjoy.  You can prevent unhealthy weight gain by eating a healthy diet, exercising regularly, not smoking, limiting alcohol, and getting enough sleep.  Talk with your health care provider or a dietitian for guidance about  healthy eating and healthy lifestyle choices. This information is not intended to replace advice given to you by your health care provider. Make sure you discuss any questions you have with your health care provider. Document Revised: 12/19/2017 Document Reviewed: 01/22/2017 Elsevier Patient Education  2020 Elsevier Inc.  

## 2020-11-27 ENCOUNTER — Other Ambulatory Visit: Payer: Self-pay | Admitting: Family Medicine

## 2020-11-27 ENCOUNTER — Encounter: Payer: Self-pay | Admitting: Family Medicine

## 2020-11-27 DIAGNOSIS — M722 Plantar fascial fibromatosis: Secondary | ICD-10-CM

## 2020-11-27 MED ORDER — MELOXICAM 15 MG PO TABS: 15.0000 mg | ORAL_TABLET | Freq: Every day | ORAL | 0 refills | Status: DC | PRN

## 2020-12-08 ENCOUNTER — Encounter: Payer: Self-pay | Admitting: Family Medicine

## 2020-12-08 ENCOUNTER — Other Ambulatory Visit: Payer: Self-pay | Admitting: Family Medicine

## 2020-12-08 DIAGNOSIS — B360 Pityriasis versicolor: Secondary | ICD-10-CM

## 2020-12-08 MED ORDER — FLUCONAZOLE 150 MG PO TABS
300.0000 mg | ORAL_TABLET | ORAL | 0 refills | Status: DC
Start: 2020-12-08 — End: 2020-12-08

## 2020-12-15 ENCOUNTER — Encounter: Payer: Self-pay | Admitting: Obstetrics and Gynecology

## 2020-12-15 ENCOUNTER — Other Ambulatory Visit: Payer: Self-pay | Admitting: Obstetrics and Gynecology

## 2020-12-15 ENCOUNTER — Other Ambulatory Visit: Payer: Self-pay

## 2020-12-15 ENCOUNTER — Ambulatory Visit (INDEPENDENT_AMBULATORY_CARE_PROVIDER_SITE_OTHER): Payer: No Typology Code available for payment source | Admitting: Obstetrics and Gynecology

## 2020-12-15 VITALS — BP 97/66 | HR 80 | Ht 59.0 in | Wt 191.6 lb

## 2020-12-15 DIAGNOSIS — E669 Obesity, unspecified: Secondary | ICD-10-CM | POA: Diagnosis not present

## 2020-12-15 DIAGNOSIS — Z7689 Persons encountering health services in other specified circumstances: Secondary | ICD-10-CM

## 2020-12-15 MED ORDER — PHENTERMINE HCL 37.5 MG PO CAPS: 37.5000 mg | ORAL_CAPSULE | ORAL | 0 refills | Status: DC

## 2020-12-15 NOTE — Progress Notes (Signed)
Pt present for weight management. Pt denies any issues or side effects from the medication phentermine.

## 2020-12-15 NOTE — Progress Notes (Signed)
    GYNECOLOGY PROGRESS NOTE  Subjective:    Patient ID: Anita Stanley, female    DOB: 11-06-1986, 34 y.o.   MRN: [id number]  HPI  Patient is a 34 y.o. female who presents for 1 month weight management follow up. She initiated use of Phentermine 1 months ago.  Denies any undesirable side effects and reports compliance with medications.    Current interventions:  1. Diet - notes she has had a little difficulty maintaining her diet during the holidays.  Is going to harder on meal prep. Notes schedule has been crazy with her job lately.  2. Activity - walking daily with dog.  3. Reports bowel movements are normal.    The following portions of the patient's history were reviewed and updated as appropriate: allergies, current medications, past family history, past medical history, past social history, past surgical history and problem list.  Review of Systems Pertinent items noted in HPI and remainder of comprehensive ROS otherwise negative.   Objective:    Vitals with BMI 12/15/2020 11/10/2020 11/07/2020  Height 4\' 11"  4\' 11"  4\' 11"   Weight 191 lbs 10 oz 196 lbs 11 oz 197 lbs 2 oz  BMI 38.68 39.71 39.79  Systolic 97 107 -  Diastolic 66 73 -  Pulse 80 83 -    General appearance: alert and no distress Abdomen: soft, non-tender.  Waist circumference 38 3/4 in.    Labs:  No new labs  Assessment:   Weight management Obesity, Body mass index is 38.7 kg/m.  Plan:   1. Discussed increasing interventions (decreasing carbs, meal prep, increasing intensity of exercising as patient notes having membership to [facility]). Will also increase Phentermine dosing from 30 mg to 37.5 mg.  2. Will f/u in 1 month for next weight visit.    , MD [facility]

## 2020-12-15 NOTE — Patient Instructions (Signed)
Preventing Unhealthy Weight Gain, Adult Staying at a healthy weight is important to your overall health. When fat builds up in your body, you may become overweight or obese. Being overweight or obese increases your risk of developing certain health problems, such as heart disease, diabetes, sleeping problems, joint problems, and some types of cancer. Unhealthy weight gain is often the result of making unhealthy food choices or not getting enough exercise. You can make changes to your lifestyle to prevent obesity and stay as healthy as possible. What nutrition changes can be made?   Eat only as much as your body needs. To do this: ? Pay attention to signs that you are hungry or full. Stop eating as soon as you feel full. ? If you feel hungry, try drinking water first before eating. Drink enough water so your urine is clear or pale yellow. ? Eat smaller portions. Pay attention to portion sizes when eating out. ? Look at serving sizes on food labels. Most foods contain more than one serving per container. ? Eat the recommended number of calories for your gender and activity level. For most active people, a daily total of 2,000 calories is appropriate. If you are trying to lose weight or are not very active, you may need to eat fewer calories. Talk with your health care provider or a diet and nutrition specialist (dietitian) about how many calories you need each day.  Choose healthy foods, such as: ? Fruits and vegetables. At each meal, try to fill at least half of your plate with fruits and vegetables. ? Whole grains, such as whole-wheat bread, brown rice, and quinoa. ? Lean meats, such as chicken or fish. ? Other healthy proteins, such as beans, eggs, or tofu. ? Healthy fats, such as nuts, seeds, fatty fish, and olive oil. ? Low-fat or fat-free dairy products.  Check food labels, and avoid food and drinks that: ? Are high in calories. ? Have added sugar. ? Are high in sodium. ? Have saturated  fats or trans fats.  Cook foods in healthier ways, such as by baking, broiling, or grilling.  Make a meal plan for the week, and shop with a grocery list to help you stay on track with your purchases. Try to avoid going to the grocery store when you are hungry.  When grocery shopping, try to shop around the outside of the store first, where the fresh foods are. Doing this helps you to avoid prepackaged foods, which can be high in sugar, salt (sodium), and fat. What lifestyle changes can be made?   Exercise for 30 or more minutes on 5 or more days each week. Exercising may include brisk walking, yard work, biking, running, swimming, and team sports like basketball and soccer. Ask your health care provider which exercises are safe for you.  Do muscle-strengthening activities, such as lifting weights or using resistance bands, on 2 or more days a week.  Do not use any products that contain nicotine or tobacco, such as cigarettes and e-cigarettes. If you need help quitting, ask your health care provider.  Limit alcohol intake to no more than 1 drink a day for nonpregnant women and 2 drinks a day for men. One drink equals 12 oz of beer, 5 oz of wine, or 1 oz of hard liquor.  Try to get 7-9 hours of sleep each night. What other changes can be made?  Keep a food and activity journal to keep track of: ? What you ate and how many calories   you had. Remember to count the calories in sauces, dressings, and side dishes. ? Whether you were active, and what exercises you did. ? Your calorie, weight, and activity goals.  Check your weight regularly. Track any changes. If you notice you have gained weight, make changes to your diet or activity routine.  Avoid taking weight-loss medicines or supplements. Talk to your health care provider before starting any new medicine or supplement.  Talk to your health care provider before trying any new diet or exercise plan. Why are these changes  important? Eating healthy, staying active, and having healthy habits can help you to prevent obesity. Those changes also:  Help you manage stress and emotions.  Help you connect with friends and family.  Improve your self-esteem.  Improve your sleep.  Prevent long-term health problems. What can happen if changes are not made? Being obese or overweight can cause you to develop joint or bone problems, which can make it hard for you to stay active or do activities you enjoy. Being obese or overweight also puts stress on your heart and lungs and can lead to health problems like diabetes, heart disease, and some cancers. Where to find more information Talk with your health care provider or a dietitian about healthy eating and healthy lifestyle choices. You may also find information from:  U.S. Department of Agriculture, MyPlate: www.choosemyplate.gov  American Heart Association: www.heart.org  Centers for Disease Control and Prevention: www.cdc.gov Summary  Staying at a healthy weight is important to your overall health. It helps you to prevent certain diseases and health problems, such as heart disease, diabetes, joint problems, sleep disorders, and some types of cancer.  Being obese or overweight can cause you to develop joint or bone problems, which can make it hard for you to stay active or do activities you enjoy.  You can prevent unhealthy weight gain by eating a healthy diet, exercising regularly, not smoking, limiting alcohol, and getting enough sleep.  Talk with your health care provider or a dietitian for guidance about healthy eating and healthy lifestyle choices. This information is not intended to replace advice given to you by your health care provider. Make sure you discuss any questions you have with your health care provider. Document Revised: 12/19/2017 Document Reviewed: 01/22/2017 Elsevier Patient Education  2020 Elsevier Inc.  

## 2021-01-09 ENCOUNTER — Other Ambulatory Visit: Payer: No Typology Code available for payment source

## 2021-01-09 ENCOUNTER — Encounter: Payer: Self-pay | Admitting: Family Medicine

## 2021-01-09 ENCOUNTER — Other Ambulatory Visit: Payer: Self-pay

## 2021-01-09 ENCOUNTER — Telehealth (INDEPENDENT_AMBULATORY_CARE_PROVIDER_SITE_OTHER): Payer: No Typology Code available for payment source | Admitting: Family Medicine

## 2021-01-09 DIAGNOSIS — N3000 Acute cystitis without hematuria: Secondary | ICD-10-CM

## 2021-01-09 DIAGNOSIS — Z20822 Contact with and (suspected) exposure to covid-19: Secondary | ICD-10-CM

## 2021-01-09 DIAGNOSIS — R399 Unspecified symptoms and signs involving the genitourinary system: Secondary | ICD-10-CM

## 2021-01-09 LAB — POC URINALSYSI DIPSTICK (AUTOMATED)
Bilirubin, UA: NEGATIVE
Blood, UA: POSITIVE
Glucose, UA: NEGATIVE
Ketones, UA: NEGATIVE
Leukocytes, UA: NEGATIVE
Nitrite, UA: POSITIVE
Protein, UA: POSITIVE — AB
Spec Grav, UA: >=1.03 — AB (ref 1.010–1.025)
Urobilinogen, UA: NEGATIVE E.U./dL — AB
pH, UA: 6 (ref 5.0–8.0)

## 2021-01-09 MED ORDER — NITROFURANTOIN MONOHYD MACRO 100 MG PO CAPS: 100.0000 mg | ORAL_CAPSULE | Freq: Two times a day (BID) | ORAL | 0 refills | Status: AC

## 2021-01-09 NOTE — Addendum Note (Signed)
Addended by: Erby Pian on: 01/09/2021 03:51 PM   Modules accepted: Orders

## 2021-01-09 NOTE — Progress Notes (Addendum)
I connected with Anita Stanley on 01/09/21 at 10:40 AM EST by video and verified that I am speaking with the correct person using two identifiers.   I discussed the limitations, risks, security and privacy concerns of performing an evaluation and management service by video and the availability of in person appointments. I also discussed with the patient that there may be a patient responsible charge related to this service. The patient expressed understanding and agreed to proceed.  Patient location: Home Provider Location: [place] Participants: Lesleigh Noe and Rogelia Rohrer   Subjective:     Anita Stanley is a 35 y.o. female presenting for Cough (Productive   ), Sore Throat (X 4 days   (Pt is not able to come test today. I have her on testing schedule for tomorrow if you want her tested)), Urinary Frequency (X 4 days (PT will pick up UA kit today and return today)), and Dysuria (With lower back pain X 4 days )     Cough This is a new problem. Episode onset: 11/05/2021. The cough is non-productive. Associated symptoms include nasal congestion, postnasal drip and a sore throat. Pertinent negatives include no chills, ear congestion, fever, shortness of breath or wheezing. Associated symptoms comments: hoarseness. She has tried OTC cough suppressant (steam shower) for the symptoms. The treatment provided mild relief.  Sore Throat  Associated symptoms include coughing. Pertinent negatives include no shortness of breath or vomiting. She has tried gargles for the symptoms. The treatment provided mild relief.  Urinary Frequency  Associated symptoms include flank pain, frequency, hesitancy, nausea (improved) and urgency. Pertinent negatives include no chills, discharge, hematuria or vomiting.  Dysuria  This is a new problem. The current episode started in the past 7 days. Associated symptoms include flank pain, frequency, hesitancy, nausea (improved) and urgency.  Pertinent negatives include no chills, discharge, hematuria or vomiting.   covid vaccine - no booster, vaccinated in April Exposure: 2 weeks ago, with dad and dad had a cold as well and was negative for covid No loss of taste or smell   Mucus is brown color No face pain or pressure    Review of Systems  Constitutional: Negative for chills and fever.  HENT: Positive for postnasal drip and sore throat.   Respiratory: Positive for cough. Negative for shortness of breath and wheezing.   Gastrointestinal: Positive for nausea (improved). Negative for vomiting.  Genitourinary: Positive for dysuria, flank pain, frequency, hesitancy and urgency. Negative for hematuria.     Social History   Tobacco Use  Smoking Status Former Smoker  . Quit date: 07/13/2018  . Years since quitting: 2.4  Smokeless Tobacco Never Used        Objective:   BP Readings from Last 3 Encounters:  12/15/20 97/66  11/10/20 107/73  09/20/20 96/63   Wt Readings from Last 3 Encounters:  12/15/20 191 lb 9.6 oz (86.9 kg)  11/10/20 196 lb 11.2 oz (89.2 kg)  11/07/20 197 lb 1.6 oz (89.4 kg)    LMP  (LMP Unknown)    Exam Speaking in complete sentences  Occasional cough Hoarse sounding voice        Assessment & Plan:   Problem List Items Addressed This Visit   None   Visit Diagnoses    UTI symptoms    -  Primary   Relevant Medications   nitrofurantoin, macrocrystal-monohydrate, (MACROBID) 100 MG capsule   Other Relevant Orders   POCT Urinalysis Dipstick (Automated) (Completed)  Suspected COVID-19 virus infection       Acute cystitis without hematuria       Relevant Medications   nitrofurantoin, macrocrystal-monohydrate, (MACROBID) 100 MG capsule     UTI - pt will come for collection cup and drop off samples. Treat based on results  Discussed OTC treatment for viral illness Patient was advised to call health at work and to be tested for possible covid 19 Instructed to isolate until the  results come back  UA with nitrites positive Will send antibiotic and culture   Interactive audio and video telecommunications were attempted between this provider and patient, however failed, due to patient having technical difficulties OR patient did not have access to video capability.  We continued and completed visit with audio only.   Start Time: 10:56 End Time: 11: 07    Return if symptoms worsen or fail to improve.  Lesleigh Noe, MD

## 2021-01-09 NOTE — Addendum Note (Signed)
Addended by: Lynnda Child on: 01/09/2021 02:34 PM   Modules accepted: Orders

## 2021-01-09 NOTE — Patient Instructions (Addendum)
Call health at work for the covid infection   Based on your symptoms, it looks like you have a virus.   Antibiotics are not need for a viral infection but the following will help:   1. Drink plenty of fluids 2. Get lots of rest  Sinus Congestion 1) Neti Pot (Saline rinse) -- 2 times day -- if tolerated 2) Flonase (Store Brand ok) - once daily 3) Over the counter congestion medications  Cough 1) Cough drops can be helpful 2) Nyquil (or nighttime cough medication) 3) Honey is proven to be one of the best cough medications  4) Cough medicine with Dextromethorphan can also be helpful  Sore Throat 1) Honey as above, cough drops 2) Ibuprofen or Aleve can be helpful 3) Salt water Gargles  If you develop fevers (Temperature >100.4), chills, worsening symptoms or symptoms lasting longer than 10 days return to clinic.

## 2021-01-10 ENCOUNTER — Other Ambulatory Visit: Payer: No Typology Code available for payment source

## 2021-01-11 ENCOUNTER — Encounter: Payer: Self-pay | Admitting: Family Medicine

## 2021-01-11 ENCOUNTER — Ambulatory Visit (HOSPITAL_COMMUNITY)
Admission: EM | Admit: 2021-01-11 | Discharge: 2021-01-11 | Disposition: A | Discharge status: Home / Self Care | Payer: No Typology Code available for payment source | Attending: Urgent Care | Admitting: Urgent Care

## 2021-01-11 ENCOUNTER — Encounter (HOSPITAL_COMMUNITY): Payer: Self-pay

## 2021-01-11 ENCOUNTER — Other Ambulatory Visit: Payer: Self-pay

## 2021-01-11 ENCOUNTER — Other Ambulatory Visit: Payer: Self-pay | Admitting: Family Medicine

## 2021-01-11 DIAGNOSIS — B349 Viral infection, unspecified: Secondary | ICD-10-CM

## 2021-01-11 DIAGNOSIS — N3090 Cystitis, unspecified without hematuria: Secondary | ICD-10-CM | POA: Diagnosis present

## 2021-01-11 DIAGNOSIS — M545 Low back pain, unspecified: Secondary | ICD-10-CM | POA: Diagnosis present

## 2021-01-11 DIAGNOSIS — R059 Cough, unspecified: Secondary | ICD-10-CM | POA: Diagnosis present

## 2021-01-11 DIAGNOSIS — Z20822 Contact with and (suspected) exposure to covid-19: Secondary | ICD-10-CM | POA: Diagnosis not present

## 2021-01-11 DIAGNOSIS — R0981 Nasal congestion: Secondary | ICD-10-CM | POA: Diagnosis present

## 2021-01-11 DIAGNOSIS — J029 Acute pharyngitis, unspecified: Secondary | ICD-10-CM | POA: Insufficient documentation

## 2021-01-11 DIAGNOSIS — N12 Tubulo-interstitial nephritis, not specified as acute or chronic: Secondary | ICD-10-CM

## 2021-01-11 LAB — URINE CULTURE
MICRO NUMBER:: 11404730
SPECIMEN QUALITY:: ADEQUATE

## 2021-01-11 MED ORDER — SULFAMETHOXAZOLE-TRIMETHOPRIM 800-160 MG PO TABS: 1.0000 | ORAL_TABLET | Freq: Two times a day (BID) | ORAL | 0 refills | Status: DC

## 2021-01-11 MED ORDER — CETIRIZINE HCL 10 MG PO TABS: 10.0000 mg | ORAL_TABLET | Freq: Every day | ORAL | 0 refills | Status: DC

## 2021-01-11 MED ORDER — TIZANIDINE HCL 4 MG PO TABS: 4.0000 mg | ORAL_TABLET | Freq: Three times a day (TID) | ORAL | 0 refills | Status: DC | PRN

## 2021-01-11 MED ORDER — PSEUDOEPHEDRINE HCL 60 MG PO TABS: 60.0000 mg | ORAL_TABLET | Freq: Three times a day (TID) | ORAL | 0 refills | Status: DC | PRN

## 2021-01-11 MED ORDER — BENZONATATE 100 MG PO CAPS: 100.0000 mg | ORAL_CAPSULE | Freq: Three times a day (TID) | ORAL | 0 refills | Status: DC | PRN

## 2021-01-11 MED ORDER — NAPROXEN 500 MG PO TABS: 500.0000 mg | ORAL_TABLET | Freq: Two times a day (BID) | ORAL | 0 refills | Status: DC

## 2021-01-11 MED ORDER — PROMETHAZINE-DM 6.25-15 MG/5ML PO SYRP: 5.0000 mL | ORAL_SOLUTION | Freq: Every evening | ORAL | 0 refills | Status: DC | PRN

## 2021-01-11 NOTE — ED Provider Notes (Signed)
[facility]   MRN: [id number] DOB: 1986-09-20  Subjective:   Anita Stanley is a 35 y.o. female presenting for 1 week history of acute onset sore throat, cough, sinus congestion. Patient is actually taking Bactrim right now for UTI. Has had some low back pain from this. Denies chest pain, shortness of breath. Patient is not a smoker. Denies history of lung disorder.  No current facility-administered medications for this encounter.  Current Outpatient Medications:  .  acetaminophen (TYLENOL) 500 MG tablet, Take 1 tablet (500 mg total) by mouth every 6 (six) hours as needed., Disp: 30 tablet, Rfl: 0 .  cyclobenzaprine (FLEXERIL) 10 MG tablet, Take 1 tablet (10 mg total) by mouth 3 (three) times daily as needed for muscle spasms., Disp: 30 tablet, Rfl: 1 .  ibuprofen (ADVIL) 800 MG tablet, Take 1 tablet (800 mg total) by mouth 3 (three) times daily., Disp: 21 tablet, Rfl: 0 .  levonorgestrel (MIRENA) 20 MCG/24HR IUD, 1 Intra Uterine Device (1 each total) by Intrauterine route once for 1 dose., Disp: 1 each, Rfl: 0 .  meloxicam (MOBIC) 15 MG tablet, Take 1 tablet (15 mg total) by mouth daily as needed for pain., Disp: 30 tablet, Rfl: 0 .  nitrofurantoin, macrocrystal-monohydrate, (MACROBID) 100 MG capsule, Take 1 capsule (100 mg total) by mouth 2 (two) times daily for 5 days., Disp: 10 capsule, Rfl: 0 .  phentermine 37.5 MG capsule, Take 1 capsule (37.5 mg total) by mouth every morning., Disp: 30 capsule, Rfl: 0 .  selenium sulfide (SELSUN) 2.5 % shampoo, Apply 1 application topically daily as needed for irritation., Disp: 118 mL, Rfl: 12 .  sulfamethoxazole-trimethoprim (BACTRIM DS) 800-160 MG tablet, Take 1 tablet by mouth 2 (two) times daily for 10 days., Disp: 20 tablet, Rfl: 0   Allergies  Allergen Reactions  . Morphine And Related     Past Medical History:  Diagnosis Date  . Pyelonephritis   . UTI (urinary tract infection)      Past Surgical History:   Procedure Laterality Date  . WRIST SURGERY  2012   fx    Family History  Problem Relation Age of Onset  . Diabetes Mellitus II Mother   . Diabetes Mellitus II Father   . Alcohol abuse Sister   . Diabetes Mellitus II Maternal Grandmother   . Diabetes Mellitus II Maternal Grandfather   . Diabetes Mellitus II Paternal Grandmother   . Diabetes Mellitus II Paternal Grandfather     Social History   Tobacco Use  . Smoking status: Former Smoker    Quit date: 07/13/2018    Years since quitting: 2.5  . Smokeless tobacco: Never Used  Vaping Use  . Vaping Use: Never used  Substance Use Topics  . Alcohol use: Yes    Comment: occ  . Drug use: Not Currently    Types: MDMA (Ecstacy)    ROS   Objective:   Vitals: BP 115/80   Pulse 93   Temp 98.5 F (36.9 C)   Resp 20   LMP  (LMP Unknown)   SpO2 98%   Physical Exam Constitutional:      General: She is not in acute distress.    Appearance: Normal appearance. She is well-developed. She is not ill-appearing, toxic-appearing or diaphoretic.  HENT:     Head: Normocephalic and atraumatic.     Nose: Nose normal.     Mouth/Throat:     Mouth: Mucous membranes are moist.     Pharynx:  Oropharynx is clear.  Eyes:     General: No scleral icterus.    Extraocular Movements: Extraocular movements intact.     Pupils: Pupils are equal, round, and reactive to light.  Cardiovascular:     Rate and Rhythm: Normal rate and regular rhythm.     Pulses: Normal pulses.     Heart sounds: Normal heart sounds. No murmur heard. No friction rub. No gallop.   Pulmonary:     Effort: Pulmonary effort is normal. No respiratory distress.     Breath sounds: Normal breath sounds. No stridor. No wheezing, rhonchi or rales.  Skin:    General: Skin is warm and dry.     Findings: No rash.  Neurological:     General: No focal deficit present.     Mental Status: She is alert and oriented to person, place, and time.  Psychiatric:        Mood and Affect:  Mood normal.        Behavior: Behavior normal.        Thought Content: Thought content normal.     Assessment and Plan :   PDMP not reviewed this encounter.  1. Viral syndrome   2. Cystitis   3. Cough   4. Sore throat   5. Sinus congestion   6. Acute bilateral low back pain without sciatica     Will manage for viral illness such as viral URI, viral syndrome, viral rhinitis, COVID-19. Counseled patient on nature of COVID-19 including modes of transmission, diagnostic testing, management and supportive care.  Offered scripts for symptomatic relief. COVID 19 testing is pending. Reviewed urine culture results with patient, recommended she continue on the antibiotic she is taking. Follow-up with PCP. Counseled patient on potential for adverse effects with medications prescribed/recommended today, ER and return-to-clinic precautions discussed, patient verbalized understanding.     Wallis Bamberg, PA-C 01/15/21 1216

## 2021-01-11 NOTE — ED Triage Notes (Addendum)
Pt in with c/o ST, cough, and sinus issues that have been going on for 1 week  Pt c/o sob and lower back pain that started yesterday.states she has UTI and pcp gave her bactrim which she is currently taking. Also c/o sob when she walks  Lungs clear to auscultation

## 2021-01-11 NOTE — Addendum Note (Signed)
Addended by: Lynnda Child on: 01/11/2021 03:17 PM   Modules accepted: Orders

## 2021-01-11 NOTE — Discharge Instructions (Addendum)
We will manage this as a viral syndrome. For sore throat or cough try using a honey-based tea. Use 3 teaspoons of honey with juice squeezed from half lemon. Place shaved pieces of ginger into 1/2-1 cup of water and warm over stove top. Then mix the ingredients and repeat every 4 hours as needed. Please take Tylenol 500mg-650mg every 6 hours for aches and pains, fevers. Hydrate very well with at least 2 liters of water. Eat light meals such as soups to replenish electrolytes and soft fruits, veggies. Start an antihistamine like Zyrtec, Allegra or Claritin for postnasal drainage, sinus congestion.  You can take this together with pseudoephedrine (Sudafed) at a dose of 60 mg 2-3 times a day as needed for the same kind of congestion.   ° °

## 2021-01-12 LAB — SARS CORONAVIRUS 2 (TAT 6-24 HRS): SARS Coronavirus 2: NEGATIVE

## 2021-01-31 ENCOUNTER — Encounter: Payer: Self-pay | Admitting: Obstetrics and Gynecology

## 2021-01-31 ENCOUNTER — Other Ambulatory Visit: Payer: Self-pay

## 2021-01-31 ENCOUNTER — Other Ambulatory Visit: Payer: Self-pay | Admitting: Obstetrics and Gynecology

## 2021-01-31 ENCOUNTER — Ambulatory Visit (INDEPENDENT_AMBULATORY_CARE_PROVIDER_SITE_OTHER): Payer: No Typology Code available for payment source | Admitting: Obstetrics and Gynecology

## 2021-01-31 VITALS — BP 120/80 | HR 96 | Ht 59.0 in | Wt 187.3 lb

## 2021-01-31 DIAGNOSIS — Z7689 Persons encountering health services in other specified circumstances: Secondary | ICD-10-CM | POA: Diagnosis not present

## 2021-01-31 DIAGNOSIS — E669 Obesity, unspecified: Secondary | ICD-10-CM

## 2021-01-31 MED ORDER — PHENTERMINE HCL 37.5 MG PO CAPS: 37.5000 mg | ORAL_CAPSULE | ORAL | 0 refills | Status: DC

## 2021-01-31 NOTE — Progress Notes (Signed)
Pt present for weight management. Pt denies any side effects from the medication phentermine. Pt's last visit 12/15/2020 weight 191 lb.

## 2021-01-31 NOTE — Patient Instructions (Signed)
Preventing Unhealthy Weight Gain, Adult Staying at a healthy weight is important to your overall health. When fat builds up in your body, you may become overweight or obese. Being overweight or obese increases your risk of developing certain health problems, such as heart disease, diabetes, sleeping problems, joint problems, and some types of cancer. Unhealthy weight gain is often the result of making unhealthy food choices or not getting enough exercise. You can make changes to your lifestyle to prevent obesity and stay as healthy as possible. What nutrition changes can be made?  Eat only as much as your body needs. To do this: ? Pay attention to signs that you are hungry or full. Stop eating as soon as you feel full. ? If you feel hungry, try drinking water first before eating. Drink enough water so your urine is clear or pale yellow. ? Eat smaller portions. Pay attention to portion sizes when eating out. ? Look at serving sizes on food labels. Most foods contain more than one serving per container. ? Eat the recommended number of calories for your gender and activity level. For most active people, a daily total of 2,000 calories is appropriate. If you are trying to lose weight or are not very active, you may need to eat fewer calories. Talk with your health care provider or a diet and nutrition specialist (dietitian) about how many calories you need each day.  Choose healthy foods, such as: ? Fruits and vegetables. At each meal, try to fill at least half of your plate with fruits and vegetables. ? Whole grains, such as whole-wheat bread, brown rice, and quinoa. ? Lean meats, such as chicken or fish. ? Other healthy proteins, such as beans, eggs, or tofu. ? Healthy fats, such as nuts, seeds, fatty fish, and olive oil. ? Low-fat or fat-free dairy products.  Check food labels, and avoid food and drinks that: ? Are high in calories. ? Have added sugar. ? Are high in sodium. ? Have saturated  fats or trans fats.  Cook foods in healthier ways, such as by baking, broiling, or grilling.  Make a meal plan for the week, and shop with a grocery list to help you stay on track with your purchases. Try to avoid going to the grocery store when you are hungry.  When grocery shopping, try to shop around the outside of the store first, where the fresh foods are. Doing this helps you to avoid prepackaged foods, which can be high in sugar, salt (sodium), and fat.   What lifestyle changes can be made?  Exercise for 30 or more minutes on 5 or more days each week. Exercising may include brisk walking, yard work, biking, running, swimming, and team sports like basketball and soccer. Ask your health care provider which exercises are safe for you.  Do muscle-strengthening activities, such as lifting weights or using resistance bands, on 2 or more days a week.  Do not use any products that contain nicotine or tobacco, such as cigarettes and e-cigarettes. If you need help quitting, ask your health care provider.  Limit alcohol intake to no more than 1 drink a day for nonpregnant women and 2 drinks a day for men. One drink equals 12 oz of beer, 5 oz of wine, or 1 oz of hard liquor.  Try to get 7-9 hours of sleep each night.   What other changes can be made?  Keep a food and activity journal to keep track of: ? What you ate and how   many calories you had. Remember to count the calories in sauces, dressings, and side dishes. ? Whether you were active, and what exercises you did. ? Your calorie, weight, and activity goals.  Check your weight regularly. Track any changes. If you notice you have gained weight, make changes to your diet or activity routine.  Avoid taking weight-loss medicines or supplements. Talk to your health care provider before starting any new medicine or supplement.  Talk to your health care provider before trying any new diet or exercise plan. Why are these changes  important? Eating healthy, staying active, and having healthy habits can help you to prevent obesity. Those changes also:  Help you manage stress and emotions.  Help you connect with friends and family.  Improve your self-esteem.  Improve your sleep.  Prevent long-term health problems. What can happen if changes are not made? Being obese or overweight can cause you to develop joint or bone problems, which can make it hard for you to stay active or do activities you enjoy. Being obese or overweight also puts stress on your heart and lungs and can lead to health problems like diabetes, heart disease, and some cancers. Where to find more information Talk with your health care provider or a dietitian about healthy eating and healthy lifestyle choices. You may also find information from:  U.[place]. Department of Agriculture, MyPlate: www.choosemyplate.gov  American Heart Association: www.heart.org  Centers for Disease Control and Prevention: www.cdc.gov Summary  Staying at a healthy weight is important to your overall health. It helps you to prevent certain diseases and health problems, such as heart disease, diabetes, joint problems, sleep disorders, and some types of cancer.  Being obese or overweight can cause you to develop joint or bone problems, which can make it hard for you to stay active or do activities you enjoy.  You can prevent unhealthy weight gain by eating a healthy diet, exercising regularly, not smoking, limiting alcohol, and getting enough sleep.  Talk with your health care provider or a dietitian for guidance about healthy eating and healthy lifestyle choices. This information is not intended to replace advice given to you by your health care provider. Make sure you discuss any questions you have with your health care provider. Document Revised: 04/13/2020 Document Reviewed: 04/13/2020 Elsevier Patient Education  2021 Elsevier Inc.  

## 2021-01-31 NOTE — Progress Notes (Signed)
I have reviewed the record and concur with patient management and plan. She is currently 3 months into Phentermine use. Dose was increased from 30.5 mg to 37.5 mg last visit.   Vitals and weight reviewed.   Vitals with BMI 01/31/2021 01/11/2021 12/15/2020  Height 4\' 11"  - 4\' 11"   Weight 187 lbs 5 oz - 191 lbs 10 oz  BMI 37.81 - 38.68  Systolic 120 115 97  Diastolic 80 80 66  Pulse 96 93 80    Starting weight was 196 lbs in November. Has only lost 9 lbs thus far. Will give patient 1 more additional month on Phentermine.  If no further significant weight loss noted, will need to discontinue.

## 2021-03-01 ENCOUNTER — Telehealth: Payer: No Typology Code available for payment source | Admitting: Orthopedic Surgery

## 2021-03-01 DIAGNOSIS — R399 Unspecified symptoms and signs involving the genitourinary system: Secondary | ICD-10-CM | POA: Diagnosis not present

## 2021-03-01 MED ORDER — NITROFURANTOIN MONOHYD MACRO 100 MG PO CAPS: 100.0000 mg | ORAL_CAPSULE | Freq: Two times a day (BID) | ORAL | 0 refills | Status: AC

## 2021-03-01 NOTE — Progress Notes (Signed)

## 2021-03-05 ENCOUNTER — Ambulatory Visit: Payer: No Typology Code available for payment source | Admitting: Family Medicine

## 2021-03-05 DIAGNOSIS — Z0289 Encounter for other administrative examinations: Secondary | ICD-10-CM

## 2021-03-07 ENCOUNTER — Encounter: Payer: Self-pay | Admitting: Obstetrics and Gynecology

## 2021-03-07 ENCOUNTER — Ambulatory Visit (INDEPENDENT_AMBULATORY_CARE_PROVIDER_SITE_OTHER): Payer: No Typology Code available for payment source | Admitting: Obstetrics and Gynecology

## 2021-03-07 ENCOUNTER — Other Ambulatory Visit: Payer: Self-pay

## 2021-03-07 ENCOUNTER — Ambulatory Visit: Payer: No Typology Code available for payment source | Admitting: Obstetrics and Gynecology

## 2021-03-07 VITALS — BP 110/70 | Ht 59.0 in | Wt 186.0 lb

## 2021-03-07 DIAGNOSIS — N83201 Unspecified ovarian cyst, right side: Secondary | ICD-10-CM

## 2021-03-07 DIAGNOSIS — R399 Unspecified symptoms and signs involving the genitourinary system: Secondary | ICD-10-CM | POA: Diagnosis not present

## 2021-03-07 DIAGNOSIS — R102 Pelvic and perineal pain: Secondary | ICD-10-CM | POA: Diagnosis not present

## 2021-03-07 DIAGNOSIS — M545 Low back pain, unspecified: Secondary | ICD-10-CM | POA: Diagnosis not present

## 2021-03-07 DIAGNOSIS — N390 Urinary tract infection, site not specified: Secondary | ICD-10-CM

## 2021-03-07 LAB — POCT URINALYSIS DIPSTICK
Bilirubin, UA: NEGATIVE
Glucose, UA: NEGATIVE
Ketones, UA: NEGATIVE
Leukocytes, UA: NEGATIVE
Nitrite, UA: NEGATIVE
Protein, UA: NEGATIVE
Spec Grav, UA: 1.025 (ref 1.010–1.025)
pH, UA: 5 (ref 5.0–8.0)

## 2021-03-07 NOTE — Progress Notes (Signed)
System, Provider Not In   Chief Complaint  Patient presents with  . IUD check    Pelvic and lower back pain  . Urinary Tract Infection    Urinary frequency, pressure x 2 weeks    HPI:      Anita Stanley is a 35 y.o. [id number] whose LMP was No LMP recorded. (Menstrual status: IUD)., presents today for low back pain for a few wks. Pain in lumbar area, radiating to buttocks, no sciatic involvement. Went to [facility] and had neg xray. Given baclofen and exercises to do but not doing them. 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 Pt wonders if IUD is cause of back pain. Mirena replaced 03/23/19. Pt is amenorrheic, no cramping/spotting usually. Has had pelvic cramping the past few wks with LBP. Sx come and go and last a few hrs, no bleeding. Tried ibup with some relief. Pt is sex active with husband, no pain/bleeding. IUD seen on spine xray.   Pt also with urinary frequency/pressure for a couple wks. No hematuria, dysuria. Hx of E. Coli on 1/21 and 9/21 C&S, susceptible to macrobid. Pt has been taking macrobid for a couple days recently and still with sx. No vag bleeding.   Neg pap 03/23/19  Past Medical History:  Diagnosis Date  . Pyelonephritis   . UTI (urinary tract infection)     Past Surgical History:  Procedure Laterality Date  . WRIST SURGERY  2012   fx    Family History  Problem Relation Age of Onset  . Diabetes Mellitus II Mother   . Diabetes Mellitus II Father   . Alcohol abuse Sister   . Diabetes Mellitus II Maternal Grandmother   . Diabetes Mellitus II Maternal Grandfather   . Diabetes Mellitus II Paternal Grandmother   . Diabetes Mellitus II Paternal Grandfather     Social History   Socioeconomic History  . Marital status: Married    Spouse name: Not on file  . Number of children: Not on file  . Years of education: Not on file  . Highest education level: Not on file  Occupational History  . Not on file  Tobacco Use  .  Smoking status: Former Smoker    Quit date: 07/13/2018    Years since quitting: 2.6  . Smokeless tobacco: Never Used  Vaping Use  . Vaping Use: Never used  Substance and Sexual Activity  . Alcohol use: Yes    Comment: occ  . Drug use: Not Currently    Types: MDMA (Ecstacy)  . Sexual activity: Yes    Partners: Male    Birth control/protection: I.U.D.    Comment: Mirena  Other Topics Concern  . Not on file  Social History Narrative   Lives with husband, son and daughter.    Works at [employer] as Psychologist, sport and exercise. In nursing school.    Enjoys shopping, travel.    Social Determinants of Health   Financial Resource Strain: Not on file  Food Insecurity: Not on file  Transportation Needs: Not on file  Physical Activity: Not on file  Stress: Not on file  Social Connections: Not on file  Intimate Partner Violence: Not on file    Outpatient Medications Prior to Visit  Medication Sig Dispense Refill  . diclofenac (VOLTAREN) 75 MG EC tablet Take 75 mg by mouth 2 (two) times daily.    Marland Kitchen ibuprofen (ADVIL) 800 MG tablet Take 1 tablet (800 mg total) by  mouth 3 (three) times daily. 21 tablet 0  . naproxen (NAPROSYN) 500 MG tablet Take 1 tablet (500 mg total) by mouth 2 (two) times daily with a meal. 30 tablet 0  . phentermine 37.5 MG capsule Take 1 capsule (37.5 mg total) by mouth every morning. 30 capsule 0  . selenium sulfide (SELSUN) 2.5 % shampoo Apply 1 application topically daily as needed for irritation. 118 mL 12  . baclofen (LIORESAL) 10 MG tablet Take 10 mg by mouth 3 (three) times daily. (Patient not taking: Reported on 03/07/2021)    . levonorgestrel (MIRENA) 20 MCG/24HR IUD 1 Intra Uterine Device (1 each total) by Intrauterine route once for 1 dose. 1 each 0  . acetaminophen (TYLENOL) 500 MG tablet Take 1 tablet (500 mg total) by mouth every 6 (six) hours as needed. 30 tablet 0  . benzonatate (TESSALON) 100 MG capsule Take 1-2 capsules (100-200 mg total) by mouth 3 (three) times daily as  needed. (Patient not taking: Reported on 01/31/2021) 60 capsule 0  . cetirizine (ZYRTEC ALLERGY) 10 MG tablet Take 1 tablet (10 mg total) by mouth daily. 30 tablet 0  . cyclobenzaprine (FLEXERIL) 10 MG tablet Take 1 tablet (10 mg total) by mouth 3 (three) times daily as needed for muscle spasms. 30 tablet 1  . meloxicam (MOBIC) 15 MG tablet Take 1 tablet (15 mg total) by mouth daily as needed for pain. (Patient not taking: Reported on 01/31/2021) 30 tablet 0  . promethazine-dextromethorphan (PROMETHAZINE-DM) 6.25-15 MG/5ML syrup Take 5 mLs by mouth at bedtime as needed for cough. (Patient not taking: Reported on 01/31/2021) 100 mL 0  . pseudoephedrine (SUDAFED) 60 MG tablet Take 1 tablet (60 mg total) by mouth every 8 (eight) hours as needed for congestion. (Patient not taking: Reported on 01/31/2021) 30 tablet 0  . tiZANidine (ZANAFLEX) 4 MG tablet Take 1 tablet (4 mg total) by mouth every 8 (eight) hours as needed. 30 tablet 0   No facility-administered medications prior to visit.      ROS:  Review of Systems  Constitutional: Negative for fever, malaise/fatigue and weight loss.  Gastrointestinal: Negative for blood in stool, constipation, diarrhea, nausea and vomiting.  Genitourinary: Positive for frequency and pelvic pain. Negative for dyspareunia, dysuria, flank pain, hematuria, urgency, vaginal bleeding, vaginal discharge and vaginal pain.  Musculoskeletal: Positive for back pain.  Skin: Negative for itching and rash.    OBJECTIVE:   Vitals:  BP 110/70   Ht 4\' 11"  (1.499 m)   Wt 186 lb (84.4 kg)   BMI 37.57 kg/m   Physical Exam Vitals reviewed.  Constitutional:      Appearance: She is well-developed. She is not ill-appearing or toxic-appearing.  Pulmonary:     Effort: Pulmonary effort is normal.  Genitourinary:    General: Normal vulva.     Pubic Area: No rash.      Labia:        Right: No rash, tenderness or lesion.        Left: No rash, tenderness or lesion.      Vagina:  Normal. No vaginal discharge, erythema or tenderness.     Cervix: Normal.     Uterus: Normal. Not enlarged and not tender.      Adnexa: Right adnexa normal and left adnexa normal.       Right: No mass or tenderness.         Left: No mass or tenderness.       Comments: IUD STRINGS NOT SEEN OR  PALPATED ON EXAM Musculoskeletal:        General: Normal range of motion.     Cervical back: Normal range of motion.     Lumbar back: Tenderness present. No signs of trauma. Normal range of motion.       Back:  Skin:    General: Skin is warm and dry.  Neurological:     General: No focal deficit present.     Mental Status: She is alert and oriented to person, place, and time.     Cranial Nerves: No cranial nerve deficit.  Psychiatric:        Mood and Affect: Mood normal.        Behavior: Behavior normal.        Thought Content: Thought content normal.        Judgment: Judgment normal.     Results: Results for orders placed or performed in visit on 03/07/21 (from the past 24 hour(s))  POCT Urinalysis Dipstick     Status: Normal   Collection Time: 03/07/21  5:25 PM  Result Value Ref Range   Color, UA yellow    Clarity, UA clear    Glucose, UA Negative Negative   Bilirubin, UA neg    Ketones, UA neg    Spec Grav, UA 1.025 1.010 - 1.025   Blood, UA small    pH, UA 5.0 5.0 - 8.0   Protein, UA Negative Negative   Urobilinogen, UA     Nitrite, UA neg    Leukocytes, UA Negative Negative   Appearance     Odor       Assessment/Plan: Pelvic pain - Plan: US PELVIS TRANSVAGINAL NON-OB (TV ONLY); cramping sx. IUD strings not seen/palpated. Check placement with GYN u/s. Will f/u with results. If neg, could be related to LBP. NSAIDs.   UTI symptoms - Plan: POCT Urinalysis Dipstick, Urine Culture; taking macrobid. Small blood on UA. Check C&S. If neg, then bacteria responds to macrobid. If pos, will change abx. Will refer to urology due to recurrent UTIs (3 since 9/21).   Acute bilateral low  back pain without sciatica--MSK, tender to palpate. Stretch/massage/heat/ice/NSAIDs. Decrease lifting. F/u prn.     Return in about 2 weeks (around 03/21/2021) for GYN u/s for IUD placement (not urgent)--[facility] to call pt.  Alicia B. Copland, PA-C 03/07/2021 5:26 PM

## 2021-03-07 NOTE — Patient Instructions (Signed)
I value your feedback and you entrusting us with your care. If you get a [facility] patient survey, I would appreciate you taking the time to let us know about your experience today. Thank you! ? ? ?

## 2021-03-09 LAB — URINE CULTURE

## 2021-03-09 NOTE — Addendum Note (Signed)
Addended by: Althea Grimmer B on: 03/09/2021 09:13 AM   Modules accepted: Orders

## 2021-03-23 ENCOUNTER — Encounter: Payer: No Typology Code available for payment source | Admitting: Obstetrics and Gynecology

## 2021-03-26 ENCOUNTER — Ambulatory Visit
Admission: RE | Admit: 2021-03-26 | Discharge: 2021-03-26 | Disposition: A | Discharge status: Home / Self Care | Payer: No Typology Code available for payment source | Source: Ambulatory Visit | Attending: Obstetrics and Gynecology | Admitting: Obstetrics and Gynecology

## 2021-03-26 ENCOUNTER — Other Ambulatory Visit: Payer: Self-pay

## 2021-03-26 DIAGNOSIS — R102 Pelvic and perineal pain: Secondary | ICD-10-CM | POA: Insufficient documentation

## 2021-03-26 NOTE — Addendum Note (Signed)
Addended by: Althea Grimmer B on: 03/26/2021 03:18 PM   Modules accepted: Orders

## 2021-03-28 ENCOUNTER — Other Ambulatory Visit: Payer: Self-pay | Admitting: Obstetrics and Gynecology

## 2021-03-28 ENCOUNTER — Ambulatory Visit (INDEPENDENT_AMBULATORY_CARE_PROVIDER_SITE_OTHER): Payer: No Typology Code available for payment source | Admitting: Obstetrics and Gynecology

## 2021-03-28 ENCOUNTER — Encounter: Payer: Self-pay | Admitting: Obstetrics and Gynecology

## 2021-03-28 ENCOUNTER — Other Ambulatory Visit: Payer: Self-pay

## 2021-03-28 VITALS — BP 114/80 | HR 102 | Ht 59.0 in | Wt 187.2 lb

## 2021-03-28 DIAGNOSIS — Z7689 Persons encountering health services in other specified circumstances: Secondary | ICD-10-CM | POA: Diagnosis not present

## 2021-03-28 DIAGNOSIS — E669 Obesity, unspecified: Secondary | ICD-10-CM

## 2021-03-28 MED ORDER — PHENTERMINE HCL 37.5 MG PO CAPS: 37.5000 mg | ORAL_CAPSULE | ORAL | 0 refills | Status: DC

## 2021-03-28 NOTE — Progress Notes (Signed)
Pt present of weight management. Pt's last visit 01/31/2021 wt 187 lb. Pt waist measurement 41 inches.

## 2021-03-28 NOTE — Progress Notes (Signed)
    GYNECOLOGY PROGRESS NOTE  Subjective:    Patient ID: Anita Stanley, female    DOB: 1986/11/25, 35 y.o.   MRN: [id number]  HPI  Patient is a 35 y.o. female who presents for 1 month weight management follow up. Dosing  She initiated use of Phentermine 4 months ago.  Denies any undesirable side effects and reports compliance with medications.      Current interventions:  1. Diet - Has recently started the "17-day diet" that she found online.  Diet consists of increasing protein, decreasing intake of fruits (due to sugar content).  Also drinks green tea with each meal.  2. Activity -  She reports that she has not been as active for the past few weeks due to a minor back injury at work.  Received Baclofen to help with pain but this caused drowsiness so was unable to use. Also reports that she plans to sign up for a boot camp tomorrow.  Will also try yoga for back stretches.  3. Reports bowel movements are normal.    The following portions of the patient's history were reviewed and updated as appropriate: allergies, current medications, past family history, past medical history, past social history, past surgical history and problem list.  Review of Systems Pertinent items noted in HPI and remainder of comprehensive ROS otherwise negative.   Objective:    Vitals with BMI 03/28/2021 03/07/2021 01/31/2021  Height 4\' 11"  4\' 11"  4\' 11"   Weight 187 lbs 3 oz 186 lbs 187 lbs 5 oz  BMI 37.79 37.55 37.81  Systolic 114 110  Diastolic 80 70 80  Pulse 102 - 96    General appearance: alert and no distress Abdomen: soft, non-tender.  Waist circumference 38 3/4 in.    Labs:  No new labs  Assessment:   Weight management Obesity, Body mass index is 37.81 kg/m.  Plan:   1. Patient currently on 37.5 mg dosing of Phentermine.  Notes that she has reached a plateau with her weight loss.  Discussed that she would likely need to come off medication as she is not noting much change, however  patient desires 1 additional month as she is going to start her boot camp and is currently trying a new diet. Will give 1 additional month to attempt further weight loss. Will also increase regimen to alternating doses (1 tab on one day, followed by 2 tablets the next).  2. Will f/u in 1 month for next weight visit.     , MD [facility]

## 2021-03-28 NOTE — Patient Instructions (Signed)
Preventing Unhealthy Weight Gain, Adult Staying at a healthy weight is important to your overall health. When fat builds up in your body, you may become overweight or obese. Being overweight or obese increases your risk of developing certain health problems, such as heart disease, diabetes, sleeping problems, joint problems, and some types of cancer. Unhealthy weight gain is often the result of making unhealthy food choices or not getting enough exercise. You can make changes to your lifestyle to prevent obesity and stay as healthy as possible. What nutrition changes can be made?  Eat only as much as your body needs. To do this: ? Pay attention to signs that you are hungry or full. Stop eating as soon as you feel full. ? If you feel hungry, try drinking water first before eating. Drink enough water so your urine is clear or pale yellow. ? Eat smaller portions. Pay attention to portion sizes when eating out. ? Look at serving sizes on food labels. Most foods contain more than one serving per container. ? Eat the recommended number of calories for your gender and activity level. For most active people, a daily total of 2,000 calories is appropriate. If you are trying to lose weight or are not very active, you may need to eat fewer calories. Talk with your health care provider or a diet and nutrition specialist (dietitian) about how many calories you need each day.  Choose healthy foods, such as: ? Fruits and vegetables. At each meal, try to fill at least half of your plate with fruits and vegetables. ? Whole grains, such as whole-wheat bread, brown rice, and quinoa. ? Lean meats, such as chicken or fish. ? Other healthy proteins, such as beans, eggs, or tofu. ? Healthy fats, such as nuts, seeds, fatty fish, and olive oil. ? Low-fat or fat-free dairy products.  Check food labels, and avoid food and drinks that: ? Are high in calories. ? Have added sugar. ? Are high in sodium. ? Have saturated  fats or trans fats.  Cook foods in healthier ways, such as by baking, broiling, or grilling.  Make a meal plan for the week, and shop with a grocery list to help you stay on track with your purchases. Try to avoid going to the grocery store when you are hungry.  When grocery shopping, try to shop around the outside of the store first, where the fresh foods are. Doing this helps you to avoid prepackaged foods, which can be high in sugar, salt (sodium), and fat.   What lifestyle changes can be made?  Exercise for 30 or more minutes on 5 or more days each week. Exercising may include brisk walking, yard work, biking, running, swimming, and team sports like basketball and soccer. Ask your health care provider which exercises are safe for you.  Do muscle-strengthening activities, such as lifting weights or using resistance bands, on 2 or more days a week.  Do not use any products that contain nicotine or tobacco, such as cigarettes and e-cigarettes. If you need help quitting, ask your health care provider.  Limit alcohol intake to no more than 1 drink a day for nonpregnant women and 2 drinks a day for men. One drink equals 12 oz of beer, 5 oz of wine, or 1 oz of hard liquor.  Try to get 7-9 hours of sleep each night.   What other changes can be made?  Keep a food and activity journal to keep track of: ? What you ate and how   many calories you had. Remember to count the calories in sauces, dressings, and side dishes. ? Whether you were active, and what exercises you did. ? Your calorie, weight, and activity goals.  Check your weight regularly. Track any changes. If you notice you have gained weight, make changes to your diet or activity routine.  Avoid taking weight-loss medicines or supplements. Talk to your health care provider before starting any new medicine or supplement.  Talk to your health care provider before trying any new diet or exercise plan. Why are these changes  important? Eating healthy, staying active, and having healthy habits can help you to prevent obesity. Those changes also:  Help you manage stress and emotions.  Help you connect with friends and family.  Improve your self-esteem.  Improve your sleep.  Prevent long-term health problems. What can happen if changes are not made? Being obese or overweight can cause you to develop joint or bone problems, which can make it hard for you to stay active or do activities you enjoy. Being obese or overweight also puts stress on your heart and lungs and can lead to health problems like diabetes, heart disease, and some cancers. Where to find more information Talk with your health care provider or a dietitian about healthy eating and healthy lifestyle choices. You may also find information from:  U.[place]. Department of Agriculture, MyPlate: www.choosemyplate.gov  American Heart Association: www.heart.org  Centers for Disease Control and Prevention: www.cdc.gov Summary  Staying at a healthy weight is important to your overall health. It helps you to prevent certain diseases and health problems, such as heart disease, diabetes, joint problems, sleep disorders, and some types of cancer.  Being obese or overweight can cause you to develop joint or bone problems, which can make it hard for you to stay active or do activities you enjoy.  You can prevent unhealthy weight gain by eating a healthy diet, exercising regularly, not smoking, limiting alcohol, and getting enough sleep.  Talk with your health care provider or a dietitian for guidance about healthy eating and healthy lifestyle choices. This information is not intended to replace advice given to you by your health care provider. Make sure you discuss any questions you have with your health care provider. Document Revised: 04/13/2020 Document Reviewed: 04/13/2020 Elsevier Patient Education  2021 Elsevier Inc.  

## 2021-04-11 ENCOUNTER — Ambulatory Visit (INDEPENDENT_AMBULATORY_CARE_PROVIDER_SITE_OTHER): Payer: No Typology Code available for payment source | Admitting: Family Medicine

## 2021-04-11 ENCOUNTER — Encounter: Payer: Self-pay | Admitting: Family Medicine

## 2021-04-11 ENCOUNTER — Other Ambulatory Visit: Payer: Self-pay

## 2021-04-11 VITALS — BP 90/60 | HR 88 | Temp 98.2°F | Ht 59.0 in | Wt 187.2 lb

## 2021-04-11 DIAGNOSIS — R300 Dysuria: Secondary | ICD-10-CM | POA: Diagnosis not present

## 2021-04-11 DIAGNOSIS — R350 Frequency of micturition: Secondary | ICD-10-CM

## 2021-04-11 LAB — POC URINALSYSI DIPSTICK (AUTOMATED)
Bilirubin, UA: NEGATIVE
Glucose, UA: NEGATIVE
Ketones, UA: NEGATIVE
Leukocytes, UA: NEGATIVE
Nitrite, UA: NEGATIVE
Protein, UA: NEGATIVE
Spec Grav, UA: 1.015 (ref 1.010–1.025)
Urobilinogen, UA: 0.2 E.U./dL
pH, UA: 6 (ref 5.0–8.0)

## 2021-04-11 MED ORDER — AMOXICILLIN-POT CLAVULANATE 875-125 MG PO TABS: 1.0000 | ORAL_TABLET | Freq: Two times a day (BID) | ORAL | 0 refills | Status: DC

## 2021-04-11 NOTE — Progress Notes (Signed)
[name], MD, CAQ Sports Medicine  Primary Care and Sports Medicine [facility] [address] [place], [address]  Phone: [phone]  FAX: [phone]  Anita Stanley - 35 y.o. female  MRN [id number]  Date of Birth: 02-Oct-1986  Date: 04/11/2021  PCP: System, Provider Not In  Referral: No ref. provider found  Chief Complaint  Patient presents with  . Urinary Frequency  . Abdominal Pain    After eating    This visit occurred during the SARS-CoV-2 public health emergency.  Safety protocols were in place, including screening questions prior to the visit, additional usage of staff PPE, and extensive cleaning of exam room while observing appropriate contact time as indicated for disinfecting solutions.   Subjective:   Anita Stanley is a 35 y.o. very pleasant female patient with Body mass index is 37.82 kg/m. who presents with the following:  Patient presents with some burning with urination as well as some low back pain.  It does look like she had pyelonephritis and mid January of this year.  She presents today with hypogastric abdominal pain, and some abdominal pain after eating.  She has had dysuria and urgency over the last few days as well.  She denies fever.  She is having normal bowel movements.  She denies any upper respiratory symptoms, headaches, sore throat or earache.  Took some cystex OTC. Dyruria and urgency.  Review of Systems is noted in the HPI, as appropriate  Objective:   BP 90/60   Pulse 88   Temp 98.2 F (36.8 C) (Temporal)   Ht 4\' 11"  (1.499 m)   Wt 187 lb 4 oz (84.9 kg)   SpO2 98%   BMI 37.82 kg/m   GEN: No acute distress; alert,appropriate. PULM: Breathing comfortably in no respiratory distress PSYCH: Normally interactive.   ABD: S, hypogastric abd pain, ND, + BS, No rebound, No HSM  No CVAT  Laboratory and Imaging Data:  (on cystex)  Results for orders placed or performed in  visit on 04/11/21  POCT Urinalysis Dipstick (Automated)  Result Value Ref Range   Color, UA Yellow    Clarity, UA Clear    Glucose, UA Negative Negative   Bilirubin, UA Negative    Ketones, UA Negative    Spec Grav, UA 1.015 1.010 - 1.025   Blood, UA Trace    pH, UA 6.0 5.0 - 8.0   Protein, UA Negative Negative   Urobilinogen, UA 0.2 0.2 or 1.0 E.U./dL   Nitrite, UA Negative    Leukocytes, UA Negative Negative     Assessment and Plan:     ICD-10-CM   1. Dysuria  R30.0   2. Urinary frequency  R35.0 POCT Urinalysis Dipstick (Automated)    Urine Culture   Clinically, consistent with urinary tract infection.  She has had multiple in the year.  While her UA was negative, she is taking Cystex over-the-counter.  Following prior sensitivities, I am going to give her some Augmentin to take for 1 week.  Meds ordered this encounter  Medications  . amoxicillin-clavulanate (AUGMENTIN) 875-125 MG tablet    Sig: Take 1 tablet by mouth 2 (two) times daily.    Dispense:  14 tablet    Refill:  0   Medications Discontinued During This Encounter  Medication Reason  . diclofenac (VOLTAREN) 75 MG EC tablet Completed Course  . fluconazole (DIFLUCAN) 150 MG tablet Completed Course  . sulfamethoxazole-trimethoprim (BACTRIM DS) 800-160 MG tablet  Completed Course  . baclofen (LIORESAL) 10 MG tablet Completed Course  . ibuprofen (ADVIL) 800 MG tablet Duplicate  . naproxen (NAPROSYN) 500 MG tablet Duplicate   Orders Placed This Encounter  Procedures  . Urine Culture  . POCT Urinalysis Dipstick (Automated)    Follow-up: No follow-ups on file.  Signed,  Elpidio Galea. Heer Justiss, MD   Outpatient Encounter Medications as of 04/11/2021  Medication Sig  . amoxicillin-clavulanate (AUGMENTIN) 875-125 MG tablet Take 1 tablet by mouth 2 (two) times daily.  Marland Kitchen ibuprofen (ADVIL) 800 MG tablet Take 800 mg by mouth 3 (three) times daily as needed.  Marland Kitchen levonorgestrel (MIRENA) 20 MCG/24HR IUD 1 Intra Uterine  Device (1 each total) by Intrauterine route once for 1 dose.  . naproxen (NAPROSYN) 500 MG tablet Take 500 mg by mouth 2 (two) times daily as needed.  . phentermine 37.5 MG capsule TAKE 1 CAPSULE BY MOUTH EVERY MORNING. ALTERNATE 1 CAPSULE ONE DAY, THEN 2 CAPSULES NEXT DAY.  . selenium sulfide (SELSUN) 2.5 % shampoo APPLY TO THE AFFECTED AREA(S) DAILY AS NEEDED FOR IRRITATION  . [DISCONTINUED] baclofen (LIORESAL) 10 MG tablet Take 10 mg by mouth 3 (three) times daily. As needed  . [DISCONTINUED] diclofenac (VOLTAREN) 75 MG EC tablet Take 75 mg by mouth 2 (two) times daily.  . [DISCONTINUED] fluconazole (DIFLUCAN) 150 MG tablet TAKE 2 TABLETS (300 MG TOTAL) BY MOUTH ONCE A WEEK FOR 2 DOSES.  . [DISCONTINUED] ibuprofen (ADVIL) 800 MG tablet Take 1 tablet (800 mg total) by mouth 3 (three) times daily. (Patient taking differently: Take 800 mg by mouth 3 (three) times daily. As needed)  . [DISCONTINUED] naproxen (NAPROSYN) 500 MG tablet Take 1 tablet (500 mg total) by mouth 2 (two) times daily with a meal. (Patient taking differently: Take 500 mg by mouth 2 (two) times daily with a meal. As needed)  . [DISCONTINUED] sulfamethoxazole-trimethoprim (BACTRIM DS) 800-160 MG tablet TAKE 1 TABLET BY MOUTH TWICE DAILY FOR 10 DAYS   No facility-administered encounter medications on file as of 04/11/2021.

## 2021-04-12 LAB — URINE CULTURE
MICRO NUMBER:: 11765849
SPECIMEN QUALITY:: ADEQUATE

## 2021-04-25 ENCOUNTER — Encounter: Payer: Self-pay | Admitting: Obstetrics and Gynecology

## 2021-04-25 ENCOUNTER — Ambulatory Visit (INDEPENDENT_AMBULATORY_CARE_PROVIDER_SITE_OTHER): Payer: No Typology Code available for payment source | Admitting: Obstetrics and Gynecology

## 2021-04-25 ENCOUNTER — Other Ambulatory Visit: Payer: Self-pay

## 2021-04-25 VITALS — BP 123/83 | HR 101 | Ht 59.0 in | Wt 182.0 lb

## 2021-04-25 DIAGNOSIS — Z7689 Persons encountering health services in other specified circumstances: Secondary | ICD-10-CM

## 2021-04-25 MED ORDER — TOPIRAMATE 25 MG PO TABS: 25.0000 mg | ORAL_TABLET | Freq: Two times a day (BID) | ORAL | 2 refills | Status: DC

## 2021-04-25 MED ORDER — PHENTERMINE HCL 37.5 MG PO CAPS: ORAL_CAPSULE | ORAL | 0 refills | Status: DC

## 2021-04-25 NOTE — Progress Notes (Signed)
    GYNECOLOGY PROGRESS NOTE  Subjective:    Patient ID: Anita Stanley, female    DOB: July 10, 1986, 35 y.o.   MRN: [id number]  HPI  Patient is a 35 y.o. female who presents for 1 month weight management follow up. Dosing  She initiated use of Phentermine 5 months ago. Medication regimen changed last visit to every other day 2 tab dosing alternating with 1 tab. Denies any undesirable side effects and reports compliance with medications.   Weight loss goal is 145 lbs.     Current interventions:  1. Diet - Low carb diet. Notes increasing water intake.  2. Activity -  Working out twice per week. Notes difficulties doing more with changes to recent work schedule. Was not able to do boot camp as she is having to work more during the week (previously working weekends). 3. Reports bowel movements are normal.    The following portions of the patient's history were reviewed and updated as appropriate: allergies, current medications, past family history, past medical history, past social history, past surgical history and problem list.  Review of Systems Pertinent items noted in HPI and remainder of comprehensive ROS otherwise negative.   Objective:    Vitals with BMI 04/25/2021 04/11/2021 03/28/2021  Height 4\' 11"  4\' 11"  4\' 11"   Weight 182 lbs 187 lbs 4 oz 187 lbs 3 oz  BMI 36.74 37.8 37.79  Systolic 123 90 114  Diastolic 83 60 80  Pulse 101 88 102    General appearance: alert and no distress Abdomen: soft, non-tender.  Waist circumference 38 1/2 in.    Labs:  No new labs  Assessment:   Weight management Obesity, Body mass index is 36.76 kg/m.  Plan:   1. Patient currently on 37.5 mg dosing of Phentermine with alternate day dosing. Has lost 5 lbs on new regimen. Discussed slow weight loss curve, however now working with alternating regimen. Discussed concerns of long-term use of medication. Can continue dosing for 1-2 months. Also will add Topamax to regimen to aid in additional  weight loss.  Start at 25 mg per day, then increase to 50 mg after 1 week.  2. Taking a B12 supplement to help with energy. Recommended B-50 complex.  2. Will f/u in 1 month for next weight visit, nurse visit.  To f/u in 2 months for visit with MD.    A total of 25 minutes were spent face-to-face with the patient during this encounter and over half of that time dealt with counseling and coordination of care.    , MD [facility]

## 2021-04-25 NOTE — Progress Notes (Signed)
GYN-Pt present for routine weight management visit. Pt's last visit and weight; 03/28/2021; 187 lb. Today's BMI 36.76 km/m and waist measurements 38 1/2 inches.

## 2021-04-25 NOTE — Patient Instructions (Signed)
Topiramate tablets What is this medicine? TOPIRAMATE (toe PYRE a mate) is used to treat seizures in adults or children with epilepsy. It is also used for the prevention of migraine headaches. This medicine may be used for other purposes; ask your health care provider or pharmacist if you have questions. COMMON BRAND NAME(S): Topamax, Topiragen What should I tell my health care provider before I take this medicine? They need to know if you have any of these conditions:  bleeding disorder  kidney disease  lung disease  suicidal thoughts, plans, or attempt  an unusual or allergic reaction to topiramate, other medicines, foods, dyes, or preservatives  pregnant or trying to get pregnant  breast-feeding How should I use this medicine? Take this medicine by mouth with a glass of water. Follow the directions on the prescription label. Do not cut, crush or chew this medicine. Swallow the tablets whole. You can take it with or without food. If it upsets your stomach, take it with food. Take your medicine at regular intervals. Do not take it more often than directed. Do not stop taking except on your doctor's advice. A special MedGuide will be given to you by the pharmacist with each prescription and refill. Be sure to read this information carefully each time. Talk to your pediatrician regarding the use of this medicine in children. While this drug may be prescribed for children as young as 2 years of age for selected conditions, precautions do apply. Overdosage: If you think you have taken too much of this medicine contact a poison control center or emergency room at once. NOTE: This medicine is only for you. Do not share this medicine with others. What if I miss a dose? If you miss a dose, take it as soon as you can. If your next dose is to be taken in less than 6 hours, then do not take the missed dose. Take the next dose at your regular time. Do not take double or extra doses. What may  interact with this medicine? This medicine may interact with the following medications:  acetazolamide  alcohol  antihistamines for allergy, cough, and cold  aspirin and aspirin-like medicines  atropine  birth control pills  certain medicines for anxiety or sleep  certain medicines for bladder problems like oxybutynin, tolterodine  certain medicines for depression like amitriptyline, fluoxetine, sertraline  certain medicines for seizures like carbamazepine, phenobarbital, phenytoin, primidone, valproic acid, zonisamide  certain medicines for stomach problems like dicyclomine, hyoscyamine  certain medicines for travel sickness like scopolamine  certain medicines for Parkinson's disease like benztropine, trihexyphenidyl  certain medicines that treat or prevent blood clots like warfarin, enoxaparin, dalteparin, apixaban, dabigatran, and rivaroxaban  digoxin  general anesthetics like halothane, isoflurane, methoxyflurane, propofol  hydrochlorothiazide  ipratropium  lithium  medicines that relax muscles for surgery  metformin  narcotic medicines for pain  NSAIDs, medicines for pain and inflammation, like ibuprofen or naproxen  phenothiazines like chlorpromazine, mesoridazine, prochlorperazine, thioridazine  pioglitazone This list may not describe all possible interactions. Give your health care provider a list of all the medicines, herbs, non-prescription drugs, or dietary supplements you use. Also tell them if you smoke, drink alcohol, or use illegal drugs. Some items may interact with your medicine. What should I watch for while using this medicine? Visit your doctor or health care professional for regular checks on your progress. Tell your health care professional if your symptoms do not start to get better or if they get worse. Do not stop taking except   on your health care professional's advice. You may develop a severe reaction. Your health care professional  will tell you how much medicine to take. Wear a medical ID bracelet or chain. Carry a card that describes your disease and details of your medicine and dosage times. This medicine can reduce the response of your body to heat or cold. Dress warm in cold weather and stay hydrated in hot weather. If possible, avoid extreme temperatures like saunas, hot tubs, very hot or cold showers, or activities that can cause dehydration such as vigorous exercise. Check with your health care professional if you have severe diarrhea, nausea, and vomiting, or if you sweat a lot. The loss of too much body fluid may make it dangerous for you to take this medicine. You may get drowsy or dizzy. Do not drive, use machinery, or do anything that needs mental alertness until you know how this medicine affects you. Do not stand up or sit up quickly, especially if you are an older patient. This reduces the risk of dizzy or fainting spells. Alcohol may interfere with the effect of this medicine. Avoid alcoholic drinks. Tell your health care professional right away if you have any change in your eyesight. Patients and their families should watch out for new or worsening depression or thoughts of suicide. Also watch out for sudden changes in feelings such as feeling anxious, agitated, panicky, irritable, hostile, aggressive, impulsive, severely restless, overly excited and hyperactive, or not being able to sleep. If this happens, especially at the beginning of treatment or after a change in dose, call your healthcare professional. This medicine may cause serious skin reactions. They can happen weeks to months after starting the medicine. Contact your health care provider right away if you notice fevers or flu-like symptoms with a rash. The rash may be red or purple and then turn into blisters or peeling of the skin. Or, you might notice a red rash with swelling of the face, lips or lymph nodes in your neck or under your arms. Birth control  may not work properly while you are taking this medicine. Talk to your health care professional about using an extra method of birth control. Women should inform their health care professional if they wish to become pregnant or think they might be pregnant. There is a potential for serious side effects and harm to an unborn child. Talk to your health care professional for more information. What side effects may I notice from receiving this medicine? Side effects that you should report to your doctor or health care professional as soon as possible:  allergic reactions like skin rash, itching or hives, swelling of the face, lips, or tongue  blood in the urine  changes in vision  confusion  loss of memory  pain in lower back or side  pain when urinating  redness, blistering, peeling or loosening of the skin, including inside the mouth  signs and symptoms of bleeding such as bloody or black, tarry stools; red or dark brown urine; spitting up blood or brown material that looks like coffee grounds; red spots on the skin; unusual bruising or bleeding from the eyes, gums, or nose  signs and symptoms of increased acid in the body like breathing fast; fast heartbeat; headache; confusion; unusually weak or tired; nausea, vomiting  suicidal thoughts, mood changes  trouble speaking or understanding  unusual sweating  unusually weak or tired Side effects that usually do not require medical attention (report to your doctor or health care   professional if they continue or are bothersome):  dizziness  drowsiness  fever  loss of appetite  nausea, vomiting  pain, tingling, numbness in the hands or feet  stomach pain  tiredness  upset stomach This list may not describe all possible side effects. Call your doctor for medical advice about side effects. You may report side effects to FDA at 1-800-FDA-1088. Where should I keep my medicine? Keep out of the reach of children and  pets. Store between 15 and 30 degrees C (59 and 86 degrees F). Protect from moisture. Keep the container tightly closed. Get rid of any unused medicine after the expiration date. To get rid of medicines that are no longer needed or have expired:  Take the medicine to a medicine take-back program. Check with your pharmacy or law enforcement to find a location.  If you cannot return the medicine, check the label or package insert to see if the medicine should be thrown out in the garbage or flushed down the toilet. If you are not sure, ask your health care provider. If it is safe to put it in the trash, empty the medicine out of the container. Mix the medicine with cat litter, dirt, coffee grounds, or other unwanted substance. Seal the mixture in a bag or container. Put it in the trash. NOTE: This sheet is a summary. It may not cover all possible information. If you have questions about this medicine, talk to your doctor, pharmacist, or health care provider.  2021 Elsevier/Gold Standard (2020-06-29 15:41:57)  

## 2021-05-18 ENCOUNTER — Encounter: Payer: Self-pay | Admitting: Family Medicine

## 2021-05-18 ENCOUNTER — Other Ambulatory Visit: Payer: Self-pay

## 2021-05-18 ENCOUNTER — Ambulatory Visit (INDEPENDENT_AMBULATORY_CARE_PROVIDER_SITE_OTHER): Payer: No Typology Code available for payment source | Admitting: Family Medicine

## 2021-05-18 VITALS — BP 120/72 | HR 106 | Temp 98.0°F | Ht 59.0 in | Wt 182.0 lb

## 2021-05-18 DIAGNOSIS — M545 Low back pain, unspecified: Secondary | ICD-10-CM

## 2021-05-18 DIAGNOSIS — R300 Dysuria: Secondary | ICD-10-CM

## 2021-05-18 LAB — POC URINALSYSI DIPSTICK (AUTOMATED)
Bilirubin, UA: NEGATIVE
Glucose, UA: NEGATIVE
Ketones, UA: NEGATIVE
Leukocytes, UA: NEGATIVE
Nitrite, UA: POSITIVE
Protein, UA: NEGATIVE
Spec Grav, UA: >=1.03 — AB (ref 1.010–1.025)
Urobilinogen, UA: 0.2 E.U./dL
pH, UA: 6 (ref 5.0–8.0)

## 2021-05-18 MED ORDER — CEPHALEXIN 500 MG PO CAPS: 500.0000 mg | ORAL_CAPSULE | Freq: Two times a day (BID) | ORAL | 0 refills | Status: DC

## 2021-05-18 MED ORDER — TOPIRAMATE 25 MG PO TABS: 25.0000 mg | ORAL_TABLET | Freq: Every day | ORAL | Status: DC

## 2021-05-18 NOTE — Progress Notes (Signed)
This visit occurred during the SARS-CoV-2 public health emergency.  Safety protocols were in place, including screening questions prior to the visit, additional usage of staff PPE, and extensive cleaning of exam room while observing appropriate contact time as indicated for disinfecting solutions.  Dysuria: yes, frequency and pressure  duration of symptoms: about 1 week abdominal pain:no fevers:no back pain:not from UTI, she has preexisting back pain.  Vomiting:no U/a d/w pt.    She needs referral to [facility].  She has f/u pending.  Lower back pain, B paraspinal muscle pain.    Meds, vitals, and allergies reviewed.  Per HPI unless specifically indicated in ROS section   GEN: nad, alert and oriented HEENT: ncat NECK: supple CV: rrr.  PULM: ctab, no inc wob ABD: soft, +bs, suprapubic area not tender EXT: no edema SKIN: no acute rash BACK: no CVA pain

## 2021-05-18 NOTE — Patient Instructions (Signed)
Drink plenty of water and start the antibiotics today.  We'll contact you with your lab report.  Take care.  Glad to see you.  

## 2021-05-20 DIAGNOSIS — R300 Dysuria: Secondary | ICD-10-CM | POA: Insufficient documentation

## 2021-05-20 DIAGNOSIS — M545 Low back pain, unspecified: Secondary | ICD-10-CM | POA: Insufficient documentation

## 2021-05-20 LAB — URINE CULTURE
MICRO NUMBER:: 11916414
SPECIMEN QUALITY:: ADEQUATE

## 2021-05-20 NOTE — Assessment & Plan Note (Signed)
Refer to orthopedics 

## 2021-05-20 NOTE — Assessment & Plan Note (Signed)
Check urine culture.  Start Keflex.  Okay for outpatient follow-up.  Rationale discussed with patient.  Urinalysis discussed with patient.

## 2021-05-22 ENCOUNTER — Encounter: Payer: Self-pay | Admitting: Obstetrics and Gynecology

## 2021-05-24 ENCOUNTER — Encounter: Payer: Self-pay | Admitting: Family Medicine

## 2021-05-25 ENCOUNTER — Telehealth: Payer: Self-pay | Admitting: Family Medicine

## 2021-05-25 MED ORDER — CEPHALEXIN 500 MG PO CAPS: 500.0000 mg | ORAL_CAPSULE | Freq: Two times a day (BID) | ORAL | 0 refills | Status: DC

## 2021-05-25 NOTE — Addendum Note (Signed)
Addended by: Joaquim Nam on: 05/25/2021 09:20 AM   Modules accepted: Orders

## 2021-05-25 NOTE — Telephone Encounter (Signed)
Please triage patient about mychart message below. Thanks.  ===============  Hello Doctor Para March I was seen in your office on 05/18/21 for a uti and was prescribed Keflex 500mg  x2 day.  I am having left flank pain  that stated about 2 days ago and has been unrelieved by ibuprofen/Tylenol and seems to worsen when i am lying or sitting. I am still having frequent urination, no fevers, burning or pressure when I urinate. I do have 1 more day left to complete the total dosage of keflex  as prescribed.

## 2021-05-25 NOTE — Telephone Encounter (Addendum)
Unable to reach pt by only contact #; left v/m requesting pt to call [facility] triage. Pt called back; pt said she has seen some improvement in burning upon urination. Pt said lt flank and lt lower back has not improved; last night lower back pain worsened but now pain level is 5; pt taking diclofenac 75 mg bid for pain and it helps some but does not take pain completely away.no fever, no abd pain. Pt is having more frequency of urine but pt is drinking more fluids; no urgency. Pt said the pain is worse when sitting or laying. Pt said she is hurting so badly she could not sleep. [facility] is supposed to see pt first of July because pt has had back pain due to low back pain; pt doing stretches and CT of back did not give reason for pts pain since March 2022 and no known injury. Pt has no hx of kidney stones and no blood in urine. Dr Para March said he will extend the abx since still having pain and burning upon urination; pt will contact [facility] to see if can be seen sooner and if not pt will go to [facility] in [place]. Pt will ck with pharmacy later today to get abx. Sending note to Dr Para March. [facility]

## 2021-05-25 NOTE — Telephone Encounter (Signed)
LMTCB

## 2021-05-25 NOTE — Telephone Encounter (Signed)
Agree.  rx sent.  Please get update on patient early next week.  Thanks.

## 2021-05-30 NOTE — Telephone Encounter (Signed)
LMTCB

## 2021-06-01 ENCOUNTER — Other Ambulatory Visit: Payer: Self-pay

## 2021-06-01 ENCOUNTER — Ambulatory Visit (INDEPENDENT_AMBULATORY_CARE_PROVIDER_SITE_OTHER): Payer: No Typology Code available for payment source

## 2021-06-01 VITALS — BP 110/60 | HR 80 | Ht 59.0 in | Wt 182.2 lb

## 2021-06-01 DIAGNOSIS — Z7689 Persons encountering health services in other specified circumstances: Secondary | ICD-10-CM

## 2021-06-01 NOTE — Progress Notes (Signed)
Pt present for weight management. Pt stated that she was doing well. BMI today 36.80 waist 38 1/2.

## 2021-06-07 ENCOUNTER — Other Ambulatory Visit (HOSPITAL_COMMUNITY): Payer: Self-pay

## 2021-07-18 ENCOUNTER — Encounter: Payer: Self-pay | Admitting: Obstetrics and Gynecology

## 2021-07-18 ENCOUNTER — Other Ambulatory Visit: Payer: Self-pay

## 2021-07-18 ENCOUNTER — Ambulatory Visit (INDEPENDENT_AMBULATORY_CARE_PROVIDER_SITE_OTHER): Payer: No Typology Code available for payment source | Admitting: Obstetrics and Gynecology

## 2021-07-18 VITALS — BP 113/78 | HR 92 | Ht 59.0 in | Wt 180.3 lb

## 2021-07-18 DIAGNOSIS — E669 Obesity, unspecified: Secondary | ICD-10-CM

## 2021-07-18 DIAGNOSIS — R399 Unspecified symptoms and signs involving the genitourinary system: Secondary | ICD-10-CM | POA: Diagnosis not present

## 2021-07-18 DIAGNOSIS — Z7689 Persons encountering health services in other specified circumstances: Secondary | ICD-10-CM

## 2021-07-18 LAB — POCT URINALYSIS DIPSTICK
Bilirubin, UA: NEGATIVE
Glucose, UA: NEGATIVE
Ketones, UA: NEGATIVE
Leukocytes, UA: NEGATIVE
Nitrite, UA: POSITIVE
Protein, UA: NEGATIVE
Spec Grav, UA: 1.02 (ref 1.010–1.025)
Urobilinogen, UA: 0.2 E.U./dL
pH, UA: 6.5 (ref 5.0–8.0)

## 2021-07-18 MED ORDER — NALTREXONE-BUPROPION HCL ER 8-90 MG PO TB12: ORAL_TABLET | ORAL | 6 refills | Status: DC

## 2021-07-18 MED ORDER — CIPROFLOXACIN HCL 500 MG PO TABS: 500.0000 mg | ORAL_TABLET | Freq: Two times a day (BID) | ORAL | 0 refills | Status: DC

## 2021-07-18 NOTE — Progress Notes (Signed)
    GYNECOLOGY PROGRESS NOTE  Subjective:    Patient ID: Anita Stanley, female    DOB: 02/10/1986, 35 y.o.   MRN: [id number]  HPI  Patient is a 35 y.o. G73P2002 female who presents for weight management and uti symptoms (urinary frequency, lower back pain and dysuria. Notes symptoms have been ongoing for several days.    Weight management: she has been on Phentermine for the past 6 months, increased to qod double dosing with addition of Topamax. Still only with 2 further pounds of weight loss over the past 2 months. Goal weight is under 165 lbs.   Diet: meal prepping, consuming chicken breasts mostly, veggies. Also consuming tuna/salmon. Sometimes does meal replacement with a protein shake or smoothie. Doing hard-boiled eggs or egg whites, [place] sausage in the mornings that she has bootcamps, yogurts for snacks. Has increased water intake.  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   The following portions of the patient's history were reviewed and updated as appropriate: allergies, current medications, past family history, past medical history, past social history, past surgical history, and problem list.  Review of Systems A comprehensive review of systems was negative.   Objective:    Vitals with BMI 07/18/2021 06/01/2021 05/18/2021  Height 4\' 11"  4\' 11"  4\' 11"   Weight 180 lbs 5 oz 182 lbs 3 oz 182 lbs  BMI 36.4 36.78 36.74  Systolic 113 110  Diastolic 78 60 72  Pulse 92 80 106    General appearance: alert, cooperative, and appears stated age Abdomen: soft, non-tender. Waist circumference 38 1/2 in.   Assessment:  Weight management Obesity, Body mass index is 36.42 kg/m. UTI symptoms  Plan:   1. Weight management, Class II obesity Patient currently on 37.5 mg dosing of Phentermine with alternate day dosing. Also on Topamax.  Advised that she will need to discontinue medications at this time as they are not leading o weight loss.  Discussed other options, including trial  of an alternative medication, vs referral to weight loss management for alternative non-medication approach.  Patient inquires about intermittent fasting, as she notes her trainer suggested it. I discussed that this could be a beneficial approach and can engage.  She also would like to try a different weight loss medication first, prior to having a referral.  The risks and benefits and side effects of medication, such as Belviq (lorcarsin), Contrave (buproprion/naltrexone), and Saxenda (liraglutide) were discussed. The pros and cons of suppressing appetite and boosting metabolism is discussed. Risks of tolerence and addiction is discussed for selected agents discussed. Given handouts on medication as well as intermittent fasting.   Pt to call with any negative side effects and agrees to keep follow up appts.  To follow up in 2 months.   2. UTI symptoms UA with nitrates, will prescribe Cipro.      A total of 25 minutes were spent face-to-face with the patient during this encounter and over half of that time dealt with counseling and coordination of care.    , MD [facility]

## 2021-07-22 LAB — URINE CULTURE

## 2021-08-01 NOTE — Addendum Note (Signed)
Encounter addended by: Novella Olive on: 08/01/2021 3:55 PM  Actions taken: Letter saved

## 2021-08-02 ENCOUNTER — Telehealth: Payer: Self-pay

## 2021-08-02 NOTE — Telephone Encounter (Signed)
Prior Authorization Approved for Contrave. Patinet has been notified via VM and Mychart.

## 2021-08-08 ENCOUNTER — Ambulatory Visit: Payer: Self-pay | Admitting: *Deleted

## 2021-08-08 NOTE — Telephone Encounter (Signed)
Pt received a letter that there was an incidental finding with her vaginal ultrasound. Advised to send clinical call  Best contact: [phone]  Called patient to review questions. Reviewed with patient letter sent out and she is advised to follow up with her OBGYN to review results found and to discuss findings. Patient verbalized understanding and will call OBGYN in am to scheduled appt.

## 2021-08-09 ENCOUNTER — Telehealth: Payer: Self-pay

## 2021-08-09 NOTE — Telephone Encounter (Signed)
Pt had an ovarian cyst and a repeat ultrasound was suggested in 8-12 weeks (as stated on her u/s result message by me). U/S order placed. GA--pls notify pt of u/s results info from 3/22 because I guess she didn't see it. [place] Dec, can you check to see if the f/u u/s was scheduled, due 6/22? Thx.

## 2021-08-09 NOTE — Telephone Encounter (Signed)
Pt left msg on triage saying she received letter from [facility] saying there was "clinically significant incidental finding" from an U/S she had back in March 2022. Letter is in [place] under letters. Please advise.

## 2021-08-10 ENCOUNTER — Other Ambulatory Visit: Payer: Self-pay | Admitting: Obstetrics and Gynecology

## 2021-08-10 DIAGNOSIS — N83201 Unspecified ovarian cyst, right side: Secondary | ICD-10-CM

## 2021-08-10 NOTE — Progress Notes (Signed)
RTO cyst u/s order. Not done 6/22 as suggested since u/s order placed for in office and no longer have in office u/s.

## 2021-08-10 NOTE — Telephone Encounter (Signed)
Patient would need new order to scheduled this at [facility]. Per Mickel Fuchs. Please place new order. Thank you

## 2021-08-11 NOTE — Telephone Encounter (Signed)
New order placed on Friday, pls make sure gets scheduled (not sure if you do it or kellee). Thx.

## 2021-08-13 NOTE — Telephone Encounter (Signed)
Left voicemail for patient to call back to confirm scheduled appointment 

## 2021-08-16 ENCOUNTER — Ambulatory Visit: Payer: No Typology Code available for payment source

## 2021-08-22 ENCOUNTER — Ambulatory Visit: Payer: No Typology Code available for payment source

## 2021-08-27 ENCOUNTER — Ambulatory Visit
Admission: RE | Admit: 2021-08-27 | Discharge: 2021-08-27 | Disposition: A | Discharge status: Home / Self Care | Payer: No Typology Code available for payment source | Source: Ambulatory Visit | Attending: Obstetrics and Gynecology | Admitting: Obstetrics and Gynecology

## 2021-08-27 ENCOUNTER — Other Ambulatory Visit: Payer: Self-pay

## 2021-08-27 DIAGNOSIS — N83201 Unspecified ovarian cyst, right side: Secondary | ICD-10-CM | POA: Insufficient documentation

## 2021-09-24 ENCOUNTER — Ambulatory Visit
Admission: EM | Admit: 2021-09-24 | Discharge: 2021-09-24 | Disposition: A | Discharge status: Home / Self Care | Payer: No Typology Code available for payment source | Attending: Emergency Medicine | Admitting: Emergency Medicine

## 2021-09-24 ENCOUNTER — Other Ambulatory Visit: Payer: Self-pay

## 2021-09-24 ENCOUNTER — Encounter: Payer: Self-pay | Admitting: Emergency Medicine

## 2021-09-24 DIAGNOSIS — J039 Acute tonsillitis, unspecified: Secondary | ICD-10-CM | POA: Diagnosis not present

## 2021-09-24 LAB — POCT RAPID STREP A (OFFICE): Rapid Strep A Screen: NEGATIVE

## 2021-09-24 MED ORDER — LIDOCAINE VISCOUS HCL 2 % MT SOLN: 15.0000 mL | OROMUCOSAL | 0 refills | Status: DC | PRN

## 2021-09-24 MED ORDER — CEFDINIR 300 MG PO CAPS: 300.0000 mg | ORAL_CAPSULE | Freq: Two times a day (BID) | ORAL | 0 refills | Status: AC

## 2021-09-24 NOTE — Discharge Instructions (Signed)
Take cefdinir and use viscous lidocaine as prescribed.  I recommend the following for at-home care:   Push fluids (water, Gatorade, Pedialyte).   Tylenol or ibuprofen for pain or fever.  Warm salt water gargles for throat pain as needed.  Throw away toothbrush after 2 days of treatment.   Be sure to complete all of your antibiotic, even if you are feeling better sooner.  Be advised that you will be infectious for 24 hours after starting antibiotics.  Follow up with PCP if not improving in 2 - 3 days.   Return for significant worsening of pain, persistent fever, drooling, or neck stiffness.

## 2021-09-24 NOTE — ED Provider Notes (Signed)
CHIEF COMPLAINT:   Chief Complaint  Patient presents with   Sore Throat   Nasal Congestion     SUBJECTIVE/HPI:   Sore Throat  A very pleasant 35 y.o.Female presents today with about 10 days of upper respiratory symptoms which also includes sore throat.  Patient reports that sore throat is getting worse today.  Patient is concerned for strep. Patient does not report any shortness of breath, chest pain, palpitations, visual changes, weakness, tingling, headache, nausea, vomiting, diarrhea, fever, chills.   has a past medical history of Ovarian cyst, Pyelonephritis, and UTI (urinary tract infection).  ROS:  Review of Systems See Subjective/HPI Medications, Allergies and Problem List personally reviewed in Epic today OBJECTIVE:   Vitals:   09/24/21 0811  BP: 108/66  Pulse: 78  Resp: 18  Temp: 98.6 F (37 C)  SpO2: 98%    Physical Exam   General: Appears well-developed and well-nourished. No acute distress.  HEENT Head: Normocephalic and atraumatic.  Ears: Hearing grossly intact, no drainage or visible deformity. Nose: No nasal deviation. Mouth/Throat: No stridor or tracheal deviation.  + erythematous posterior pharynx noted with clear drainage present.  + white patchy exudate noted. Eyes: Conjunctivae and EOM are normal. No eye drainage or scleral icterus bilaterally.  Neck: Normal range of motion, neck is supple. No cervical, tonsillar or submandibular lymph nodes palpated.   Cardiovascular: Normal rate. Regular rhythm; no murmurs, gallops, or rubs.  Pulm/Chest: No respiratory distress. Breath sounds normal bilaterally without wheezes, rhonchi, or rales.  Neurological: Alert and oriented to person, place, and time.  Skin: Skin is warm and dry.  No rashes, lesions, abrasions or bruising noted to skin.   Psychiatric: Normal mood, affect, behavior, and thought content.   Vital signs and nursing note reviewed.   Patient stable and cooperative with examination. PROCEDURES:     LABS/X-RAYS/EKG/MEDS:   Results for orders placed or performed during the hospital encounter of 09/24/21  POCT rapid strep A  Result Value Ref Range   Rapid Strep A Screen Negative Negative    MEDICAL DECISION MAKING:   Patient presents with about 10 days of upper respiratory symptoms which also includes sore throat.  Patient reports that sore throat is getting worse today.  Patient is concerned for strep. Patient does not report any shortness of breath, chest pain, palpitations, visual changes, weakness, tingling, headache, nausea, vomiting, diarrhea, fever, chills.  Strep negative.  Given symptoms along with assessment findings and appearance of posterior pharynx, likely tonsillitis.  Rx'd cefdinir to the patient's preferred pharmacy along with viscous lidocaine to help with discomfort.  Advised about home treatment and care to include warm salt gargles, rest, fluids, Tylenol versus ibuprofen.  Advised to throw away her toothbrush after 2 days of treatment with antibiotics.  Return for any significant worsening of pain, persistent fever, drooling or neck stiffness.  Patient verbalized understanding and agreed with treatment plan.  Patient stable upon discharge. ASSESSMENT/PLAN:  1. Acute tonsillitis, unspecified etiology - cefdinir (OMNICEF) 300 MG capsule; Take 1 capsule (300 mg total) by mouth 2 (two) times daily for 10 days.  Dispense: 20 capsule; Refill: 0 - lidocaine (XYLOCAINE) 2 % solution; Use as directed 15 mLs in the mouth or throat as needed for mouth pain.  Dispense: 100 mL; Refill: 0 Instructions about new medications and side effects provided.  Plan:   Discharge Instructions      Take cefdinir and use viscous lidocaine as prescribed.  I recommend the following for at-home care:   Push  fluids (water, Gatorade, Pedialyte).   Tylenol or ibuprofen for pain or fever.  Warm salt water gargles for throat pain as needed.  Throw away toothbrush after 2 days of treatment.    Be sure to complete all of your antibiotic, even if you are feeling better sooner.  Be advised that you will be infectious for 24 hours after starting antibiotics.  Follow up with PCP if not improving in 2 - 3 days.   Return for significant worsening of pain, persistent fever, drooling, or neck stiffness.           Amalia Greenhouse, [place] 09/24/21 [phone]

## 2021-09-24 NOTE — ED Triage Notes (Signed)
Pt here with 10 days of URI sx and sore throat getting more severe today. No fever. Would like strep test.

## 2021-11-07 ENCOUNTER — Encounter: Payer: No Typology Code available for payment source | Admitting: Nurse Practitioner

## 2021-12-18 IMAGING — US US PELVIS COMPLETE WITH TRANSVAGINAL
1 series · 13 of 25 positions shown · non-contrast
Comparison: None

CLINICAL DATA: IUD placement.  Pelvic pain for several months.



[Series 1: us pelvic complete with transvaginal · 135 acquisitions, 13 frames shown]
[im 1/135]
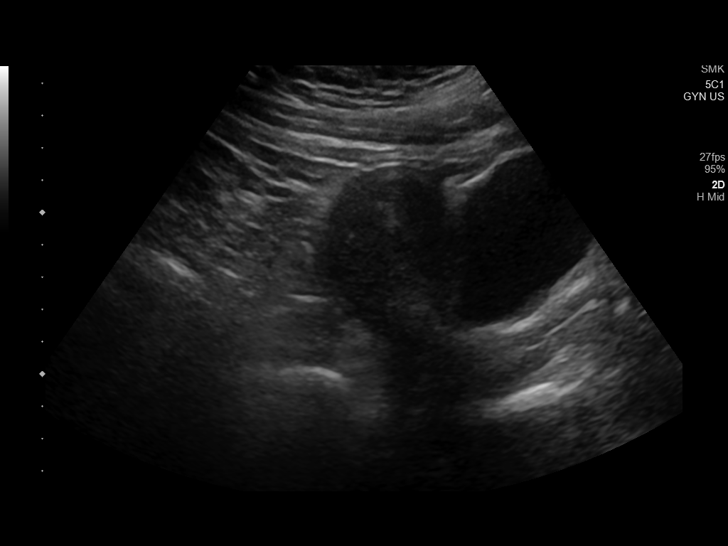
[im 12/135]
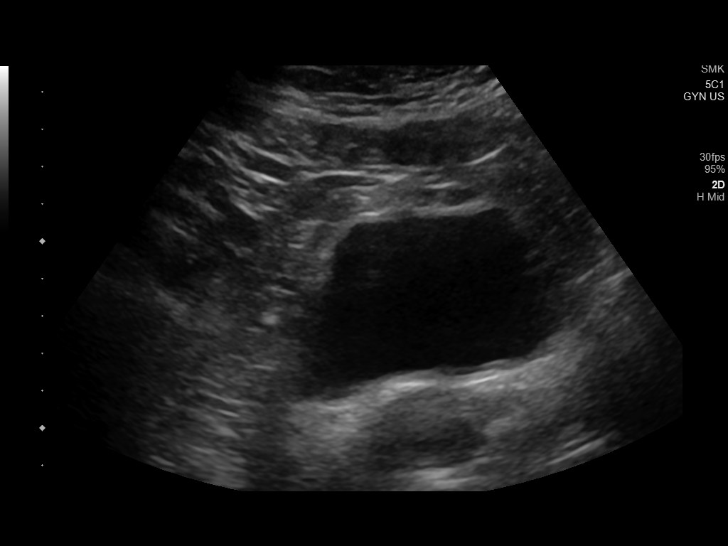
[im 23/135]
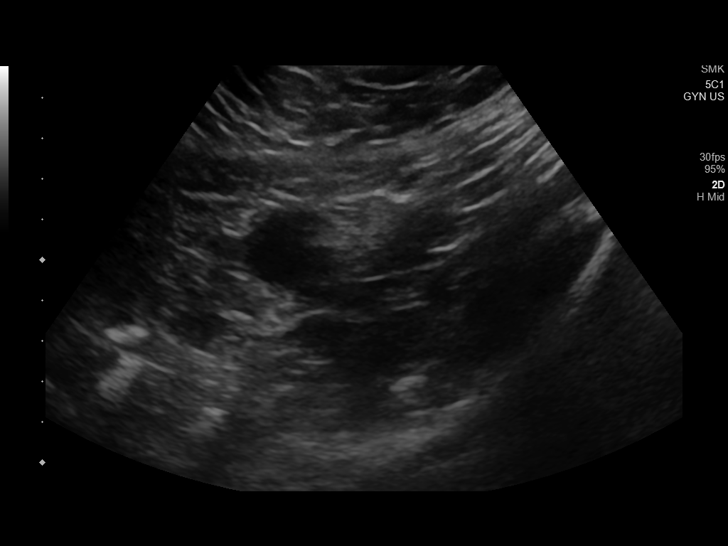
[im 34/135]
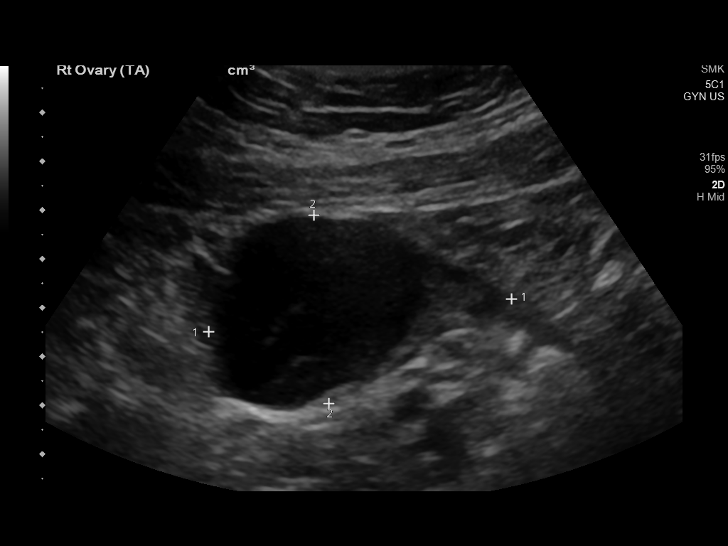
[im 45/135]
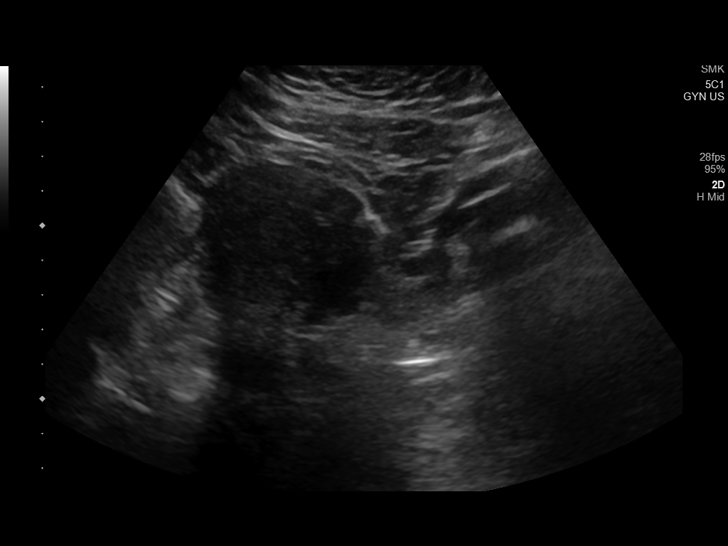
[im 56/135]
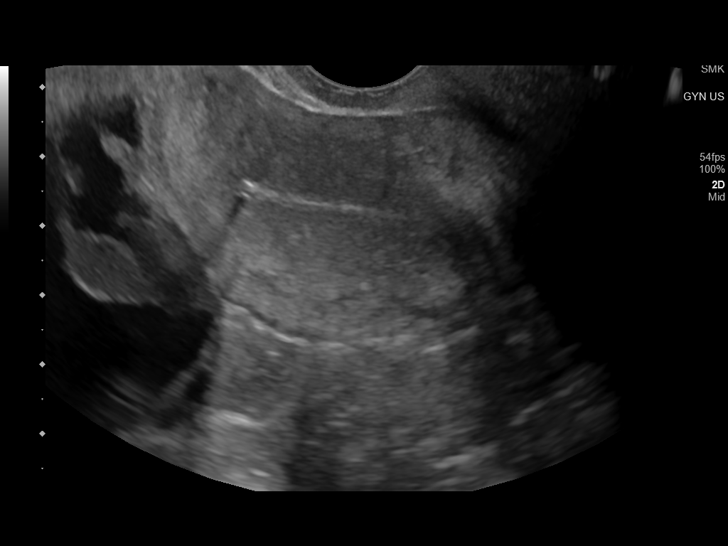
[im 68/135]
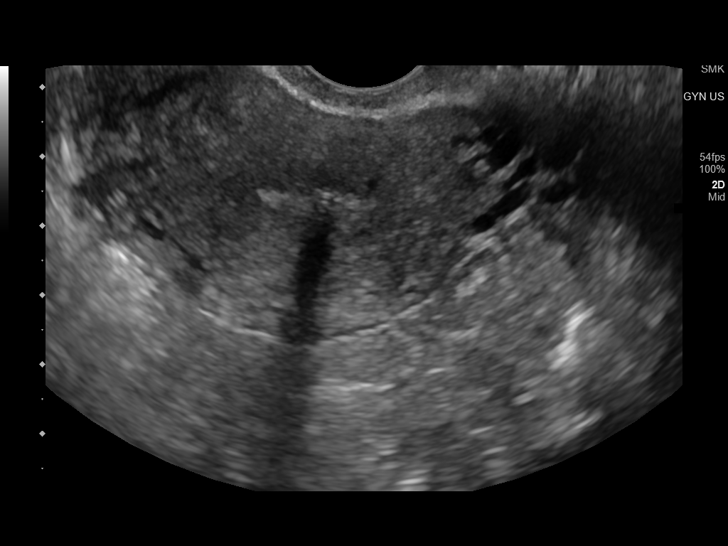
[im 79/135]
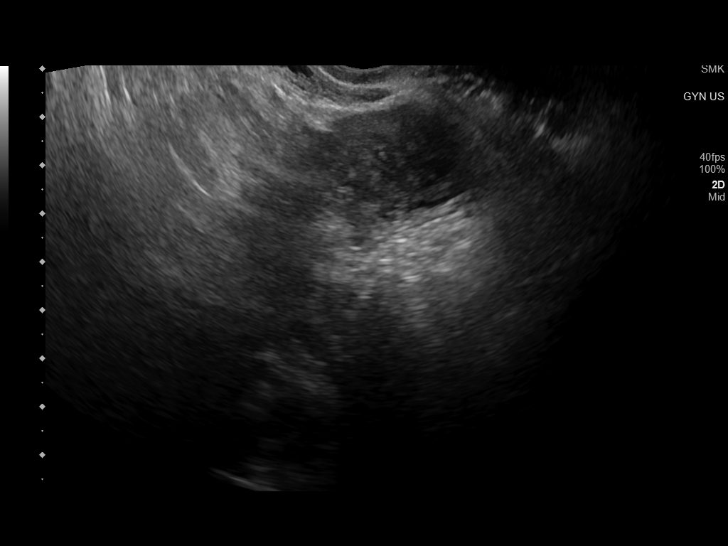
[im 90/135]
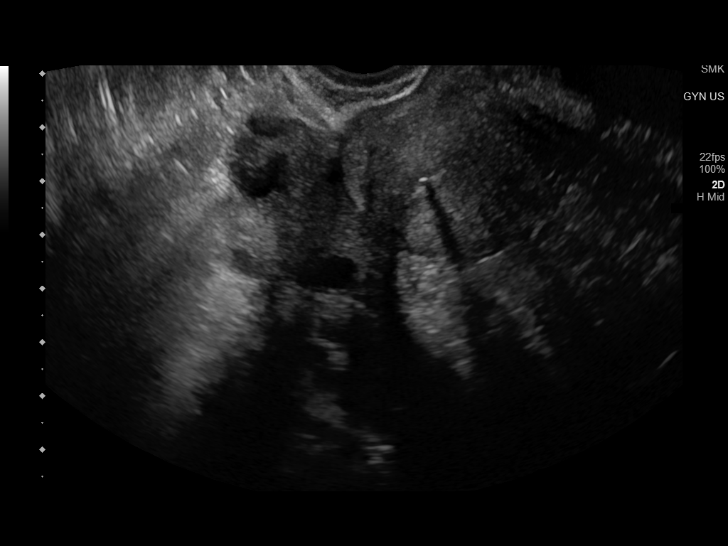
[im 101/135]
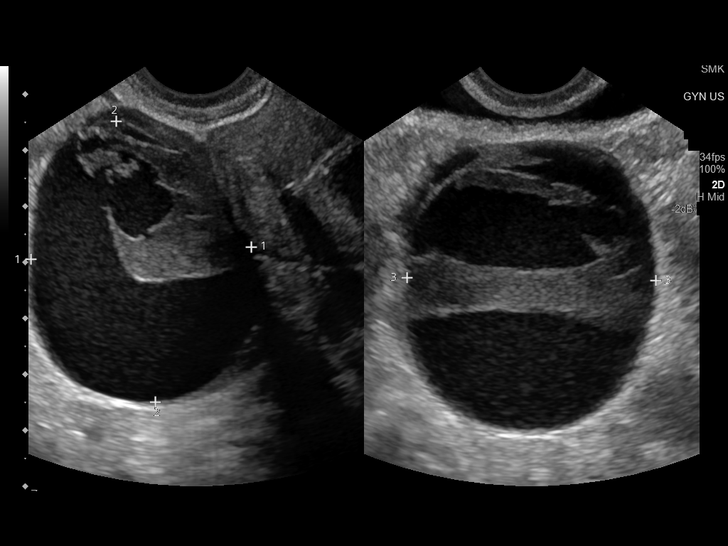
[im 112/135]
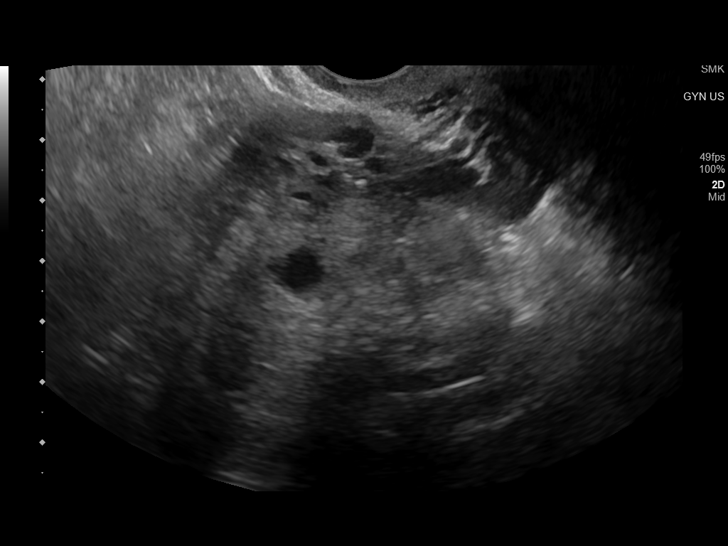
[im 123/135]
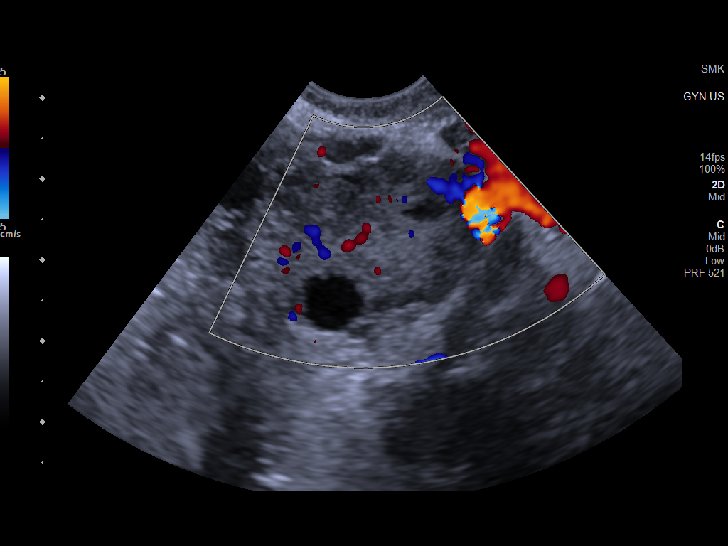
[im 135/135]
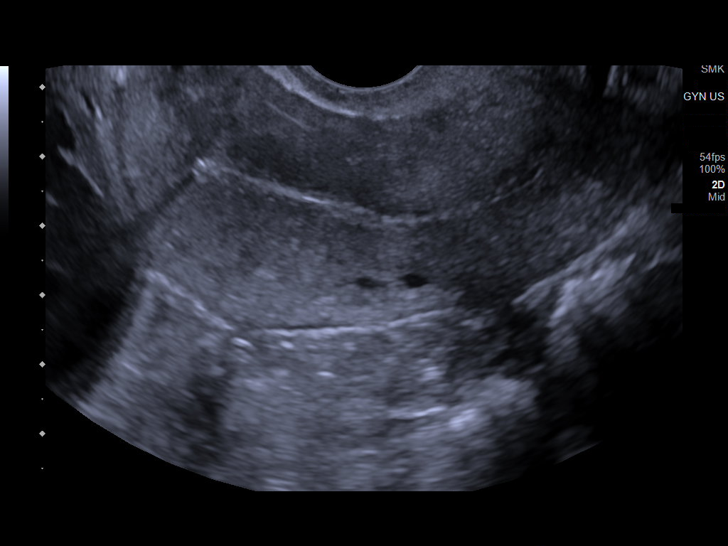

[13 of 25 positions shown; findings below may reference images not displayed]

FINDINGS: Uterus

Measurements: 8.3 x 3.5 x 5.2 cm = volume: 80 mL. No fibroids or
other mass visualized.

Endometrium

Thickness: 3 mm. An IUD is noted and appears to be grossly well
positioned.

Right ovary

Measurements: 5.5 x 4.1 x 4.9 cm = volume: 58 mL. There is a complex
mass involving the right ovary measuring 3.9 x 5.1 x 4.4 cm. This
mass appears to be primarily cystic in etiology contains septations
and peripheral areas of increased echogenicity is suggestive of a
retracting clot. This mass contains no significant internal color
Doppler flow.

Left ovary

Measurements: 2.8 x 1.7 x 1.4 cm = volume: 3.5 mL. Normal
appearance/no adnexal mass.

Other findings

No abnormal free fluid.
IMPRESSION: 1. Grossly well-positioned IUD.
2. Complex 5.1 cm cystic mass involving the patient's right ovary.
This is most consistent with a hemorrhagic cyst. A 6-12 week
follow-up is recommended to ensure resolution of this finding.

## 2021-12-26 ENCOUNTER — Encounter: Payer: No Typology Code available for payment source | Admitting: Nurse Practitioner

## 2022-03-20 ENCOUNTER — Other Ambulatory Visit: Payer: Self-pay

## 2022-03-20 ENCOUNTER — Encounter: Payer: Self-pay | Admitting: Nurse Practitioner

## 2022-03-20 ENCOUNTER — Ambulatory Visit (INDEPENDENT_AMBULATORY_CARE_PROVIDER_SITE_OTHER): Payer: No Typology Code available for payment source | Admitting: Nurse Practitioner

## 2022-03-20 VITALS — BP 104/64 | HR 82 | Temp 97.0°F | Ht 61.0 in | Wt 186.0 lb

## 2022-03-20 DIAGNOSIS — E6609 Other obesity due to excess calories: Secondary | ICD-10-CM | POA: Diagnosis not present

## 2022-03-20 DIAGNOSIS — Z0000 Encounter for general adult medical examination without abnormal findings: Secondary | ICD-10-CM | POA: Diagnosis not present

## 2022-03-20 DIAGNOSIS — Z0001 Encounter for general adult medical examination with abnormal findings: Secondary | ICD-10-CM

## 2022-03-20 DIAGNOSIS — N3001 Acute cystitis with hematuria: Secondary | ICD-10-CM

## 2022-03-20 DIAGNOSIS — R350 Frequency of micturition: Secondary | ICD-10-CM | POA: Diagnosis not present

## 2022-03-20 DIAGNOSIS — Z833 Family history of diabetes mellitus: Secondary | ICD-10-CM

## 2022-03-20 DIAGNOSIS — Z6835 Body mass index (BMI) 35.0-35.9, adult: Secondary | ICD-10-CM | POA: Diagnosis not present

## 2022-03-20 LAB — POC URINALSYSI DIPSTICK (AUTOMATED)
Bilirubin, UA: NEGATIVE
Glucose, UA: NEGATIVE
Ketones, UA: NEGATIVE
Nitrite, UA: NEGATIVE
Protein, UA: NEGATIVE
Spec Grav, UA: 1.025 (ref 1.010–1.025)
Urobilinogen, UA: 0.2 E.U./dL
pH, UA: 6 (ref 5.0–8.0)

## 2022-03-20 MED ORDER — NITROFURANTOIN MONOHYD MACRO 100 MG PO CAPS: 100.0000 mg | ORAL_CAPSULE | Freq: Two times a day (BID) | ORAL | 0 refills | Status: DC

## 2022-03-20 NOTE — Progress Notes (Signed)
Established Patient Office Visit  Subjective:  Patient ID: Anita Stanley, female    DOB: [birth year]  Age: 36 y.o. MRN: [id number]  CC:  Chief Complaint  Patient presents with   Transitions Of Care    Previous pt of Deboraha Sprang, NP.   Urinary Frequency and Odor    Started about 2 days ago.    HPI Anita Stanley presents for TOC and  urinary concerns   Urinary Symptoms: started approx 2-3 days. Takes cystex OTc approx 3 times a week. She is having pressure. Abnormal urine odor. Lower back pain  for complete physical and follow up of chronic conditions.  Immunizations: -Tetanus: 11/02/2014 -Influenza:08/30/2021 -Covid-19:Pfizer x2 -Shingles: N/A -Pneumonia: N/A  -HPV: Followed by GYN  Diet: Fair diet. 3 meals. Snacks in between on protein shake and fruit. Does not drink soda. States that she drinks sugar free monsters while working. Has been doing diet lipton green tea Exercise:  She does work with boot camp programs and personal trainers 3-4 times a week at 45 mins  Eye exam:  PRN  Dental exam: Completes semi-annually   Pap Smear: Completed in in the last year Mammogram: Completed in too young Colonoscopy: Completed in too young Dexa: N/A   Lung Cancer Screening: N/A  Sleep: goes to bed around 10 and gets up around 5am for boot camp.   Past Medical History:  Diagnosis Date   Ovarian cyst    right    Pyelonephritis    UTI (urinary tract infection)     Past Surgical History:  Procedure Laterality Date   WRIST SURGERY  2012   fx    Family History  Problem Relation Age of Onset   Diabetes Mellitus II Mother    Diabetes Mellitus II Father    Alcohol abuse Sister    Diabetes Mellitus II Maternal Grandmother    Diabetes Mellitus II Maternal Grandfather    Diabetes Mellitus II Paternal Grandmother    Diabetes Mellitus II Paternal Grandfather     Social History   Socioeconomic History   Marital status: Married    Spouse name: Not on file    Number of children: 2   Years of education: Not on file   Highest education level: Not on file  Occupational History   Not on file  Tobacco Use   Smoking status: Former    Packs/day: 0.25    Years: 18.00    Pack years: 4.50    Types: Cigarettes    Quit date: 07/13/2018    Years since quitting: 3.6    Passive exposure: Past   Smokeless tobacco: Never  Vaping Use   Vaping Use: Never used  Substance and Sexual Activity   Alcohol use: Yes    Comment: occ   Drug use: Not Currently    Types: MDMA (Ecstacy)   Sexual activity: Yes    Partners: Male    Birth control/protection: I.U.D.    Comment: Mirena  Other Topics Concern   Not on file  Social History Narrative   Lives with husband, son and daughter.    RN at [employer].     Enjoys shopping, travel.    Social Determinants of Health   Financial Resource Strain: Not on file  Food Insecurity: Not on file  Transportation Needs: Not on file  Physical Activity: Not on file  Stress: Not on file  Social Connections: Not on file  Intimate Partner Violence: Not on file    Outpatient Medications Prior to Visit  Medication Sig Dispense Refill   ibuprofen (ADVIL) 800 MG tablet Take 800 mg by mouth 3 (three) times daily as needed.     levonorgestrel (MIRENA) 20 MCG/24HR IUD 1 Intra Uterine Device (1 each total) by Intrauterine route once for 1 dose. 1 each 0   ciprofloxacin (CIPRO) 500 MG tablet Take 1 tablet (500 mg total) by mouth 2 (two) times daily. 6 tablet 0   lidocaine (XYLOCAINE) 2 % solution Use as directed 15 mLs in the mouth or throat as needed for mouth pain. 100 mL 0   Naltrexone-buPROPion HCl ER 8-90 MG TB12 Start 1 tablet every morning for 7 days, then 1 tablet twice daily for 7 days, then 2 tablets every morning and one in the evening 120 tablet 6   phentermine 37.5 MG capsule TAKE 1 CAPSULE BY MOUTH EVERY MORNING. ALTERNATE 1 CAPSULE ONE DAY, THEN 2 CAPSULES NEXT DAY. 45 capsule 0   topiramate (TOPAMAX) 25 MG tablet Take 1  tablet (25 mg total) by mouth daily.     No facility-administered medications prior to visit.    Allergies  Allergen Reactions   Morphine And Related     Hives after surgery.      ROS Review of Systems  Constitutional:  Negative for chills and fever.  Eyes:  Negative for visual disturbance.  Respiratory:  Negative for cough and shortness of breath.   Cardiovascular:  Negative for chest pain and leg swelling.  Gastrointestinal:  Negative for abdominal pain, nausea and vomiting.       BM daily   Genitourinary:  Positive for dysuria and frequency. Negative for hematuria.       Incomplete emptying  Musculoskeletal:  Positive for back pain.  Neurological:  Negative for dizziness, light-headedness, numbness and headaches.  Psychiatric/Behavioral:  Negative for hallucinations and suicidal ideas.      Objective:    Physical Exam Vitals and nursing note reviewed.  Constitutional:      Appearance: Normal appearance.  HENT:     Right Ear: Ear canal and external ear normal. There is no impacted cerumen.     Left Ear: Tympanic membrane, ear canal and external ear normal. There is no impacted cerumen.     Mouth/Throat:     Mouth: Mucous membranes are moist.     Pharynx: Oropharynx is clear.  Eyes:     Extraocular Movements: Extraocular movements intact.     Pupils: Pupils are equal, round, and reactive to light.  Cardiovascular:     Rate and Rhythm: Normal rate and regular rhythm.     Pulses: Normal pulses.     Heart sounds: Normal heart sounds.  Pulmonary:     Effort: Pulmonary effort is normal.     Breath sounds: Normal breath sounds.  Abdominal:     General: Bowel sounds are normal. There is no distension.     Palpations: There is no mass.     Tenderness: There is no abdominal tenderness. There is no right CVA tenderness or left CVA tenderness.     Hernia: No hernia is present.  Musculoskeletal:     Right lower leg: No edema.     Left lower leg: No edema.   Lymphadenopathy:     Cervical: No cervical adenopathy.  Skin:    General: Skin is warm.  Neurological:     General: No focal deficit present.     Mental Status: She is alert.     Deep Tendon Reflexes:     Reflex Scores:  Bicep reflexes are 2+ on the right side and 2+ on the left side.      Patellar reflexes are 2+ on the right side and 2+ on the left side.    Comments: Bilateral upper and lower extremity strength 5/5  Psychiatric:        Mood and Affect: Mood normal.        Behavior: Behavior normal.        Thought Content: Thought content normal.        Judgment: Judgment normal.    BP 104/64 (BP Location: Left Arm, Patient Position: Sitting, Cuff Size: Large)   Pulse 82   Temp (!) 97 F (36.1 C)   Ht 5\' 1"  (1.549 m)   Wt 186 lb (84.4 kg)   SpO2 96%   BMI 35.14 kg/m  Wt Readings from Last 3 Encounters:  03/20/22 186 lb (84.4 kg)  07/18/21 180 lb 4.8 oz (81.8 kg)  06/01/21 182 lb 3.2 oz (82.6 kg)     Health Maintenance Due  Topic Date Due   HIV Screening  Never done   Hepatitis C Screening  Never done   COVID-19 Vaccine (3 - Booster for Pfizer series) 06/02/2020   PAP SMEAR-Modifier  03/22/2022    There are no preventive care reminders to display for this patient.  Lab Results  Component Value Date   TSH 1.86 06/09/2020   Lab Results  Component Value Date   WBC 6.4 06/09/2020   HGB 13.0 06/09/2020   HCT 38.3 06/09/2020   MCV 91.2 06/09/2020   PLT 215.0 06/09/2020   Lab Results  Component Value Date   NA 138 06/09/2020   K 4.0 06/09/2020   CO2 25 06/09/2020   GLUCOSE 108 (H) 06/09/2020   BUN 15 06/09/2020   CREATININE 0.59 06/09/2020   BILITOT 0.2 06/09/2020   ALKPHOS 71 06/09/2020   AST 20 06/09/2020   ALT 26 06/09/2020   PROT 6.4 06/09/2020   ALBUMIN 4.2 06/09/2020   CALCIUM 8.8 06/09/2020   GFR 116.48 06/09/2020   Lab Results  Component Value Date   CHOL 155 07/19/2019   Lab Results  Component Value Date   HDL 46.60 07/19/2019    Lab Results  Component Value Date   LDLCALC 77 07/19/2019   Lab Results  Component Value Date   TRIG 157.0 (H) 07/19/2019   Lab Results  Component Value Date   CHOLHDL 3 07/19/2019   Lab Results  Component Value Date   HGBA1C 5.5 07/19/2019      Assessment & Plan:   Problem List Items Addressed This Visit       Genitourinary   Acute cystitis with hematuria    Given patient's symptoms history and urinalysis in office will elect to treat with Macrobid 100 mg twice daily for 5 days.  Pending urine culture.  Return precautions to clinic reviewed      Relevant Medications   nitrofurantoin, macrocrystal-monohydrate, (MACROBID) 100 MG capsule   Other Relevant Orders   Urine Culture     Other   Class 2 obesity due to excess calories without serious comorbidity with body mass index (BMI) of 35.0 to 35.9 in adult    Is working out appropriate many times a week states that she is having difficulty losing weight.  Has tried phentermine in topiramate in the past without great result.  She has been informed about Wegovy before and is interested in this.  Did tell patient that once labs return and everything looks  okay we can pursue Wegovy as her BMI is above 30.  Doing healthy lifestyle modifications      Relevant Orders   Hemoglobin A1c   TSH   Lipid panel   Urinary frequency - Primary    Treat urinary tract infections UA indicative of such      Relevant Orders   POCT Urinalysis Dipstick (Automated) (Completed)   Preventative health care    Discussed age-appropriate immunizations and screening exams.      Relevant Orders   CBC with Differential/Platelet   Comprehensive metabolic panel   Hemoglobin A1c   TSH   Lipid panel   Family history of diabetes mellitus    Strong family history of diabetes pending A1c today.      Relevant Orders   Hemoglobin A1c    Meds ordered this encounter  Medications   nitrofurantoin, macrocrystal-monohydrate, (MACROBID) 100 MG  capsule    Sig: Take 1 capsule (100 mg total) by mouth 2 (two) times daily.    Dispense:  10 capsule    Refill:  0    Order Specific Question:   Supervising Provider    Answer:   Roxy Manns A [[id number]]    Follow-up: Return in about 1 year (around 03/21/2023) for CPE.   This visit occurred during the SARS-CoV-2 public health emergency.  Safety protocols were in place, including screening questions prior to the visit, additional usage of staff PPE, and extensive cleaning of exam room while observing appropriate contact time as indicated for disinfecting solutions.   Audria Nine, NP

## 2022-03-20 NOTE — Assessment & Plan Note (Signed)
Treat urinary tract infections UA indicative of such ?

## 2022-03-20 NOTE — Patient Instructions (Signed)
Nice to see you today ?I will be in touch with the lab results ?Follow up with me in 1 year, sooner if needed ? ?If UTI does not resolve let me know ?

## 2022-03-20 NOTE — Assessment & Plan Note (Signed)
Strong family history of diabetes pending A1c today. ?

## 2022-03-20 NOTE — Assessment & Plan Note (Signed)
Discussed age-appropriate immunizations and screening exams. 

## 2022-03-20 NOTE — Assessment & Plan Note (Addendum)
Given patient's symptoms history and urinalysis in office will elect to treat with Macrobid 100 mg twice daily for 5 days.  Pending urine culture.  Return precautions to clinic reviewed ?

## 2022-03-20 NOTE — Assessment & Plan Note (Signed)
Is working out appropriate many times a week states that she is having difficulty losing weight.  Has tried phentermine in topiramate in the past without great result.  She has been informed about Wegovy before and is interested in this.  Did tell patient that once labs return and everything looks okay we can pursue Wegovy as her BMI is above 30.  Doing healthy lifestyle modifications ?

## 2022-03-21 ENCOUNTER — Other Ambulatory Visit: Payer: Self-pay | Admitting: Nurse Practitioner

## 2022-03-21 DIAGNOSIS — E6609 Other obesity due to excess calories: Secondary | ICD-10-CM

## 2022-03-21 LAB — COMPREHENSIVE METABOLIC PANEL
ALT: 46 U/L — ABNORMAL HIGH (ref 0–35)
AST: 35 U/L (ref 0–37)
Albumin: 4.8 g/dL (ref 3.5–5.2)
Alkaline Phosphatase: 60 U/L (ref 39–117)
BUN: 14 mg/dL (ref 6–23)
CO2: 30 mEq/L (ref 19–32)
Calcium: 9.2 mg/dL (ref 8.4–10.5)
Chloride: 103 mEq/L (ref 96–112)
Creatinine, Ser: 0.64 mg/dL (ref 0.40–1.20)
GFR: 113.98 mL/min (ref >60.00)
Glucose, Bld: 97 mg/dL (ref 70–99)
Potassium: 4.1 mEq/L (ref 3.5–5.1)
Sodium: 141 mEq/L (ref 135–145)
Total Bilirubin: 0.3 mg/dL (ref 0.2–1.2)
Total Protein: 6.7 g/dL (ref 6.0–8.3)

## 2022-03-21 LAB — CBC WITH DIFFERENTIAL/PLATELET
Basophils Absolute: 0 10*3/uL (ref 0.0–0.1)
Basophils Relative: 0.5 % (ref 0.0–3.0)
Eosinophils Absolute: 0.1 10*3/uL (ref 0.0–0.7)
Eosinophils Relative: 1.9 % (ref 0.0–5.0)
HCT: 40.8 % (ref 36.0–46.0)
Hemoglobin: 13.9 g/dL (ref 12.0–15.0)
Lymphocytes Relative: 22.6 % (ref 12.0–46.0)
Lymphs Abs: 1.7 10*3/uL (ref 0.7–4.0)
MCHC: 33.9 g/dL (ref 30.0–36.0)
MCV: 92.3 fl (ref 78.0–100.0)
Monocytes Absolute: 0.6 10*3/uL (ref 0.1–1.0)
Monocytes Relative: 7.3 % (ref 3.0–12.0)
Neutro Abs: 5.1 10*3/uL (ref 1.4–7.7)
Neutrophils Relative %: 67.7 % (ref 43.0–77.0)
Platelets: 229 10*3/uL (ref 150.0–400.0)
RBC: 4.42 Mil/uL (ref 3.87–5.11)
RDW: 12.2 % (ref 11.5–15.5)
WBC: 7.5 10*3/uL (ref 4.0–10.5)

## 2022-03-21 LAB — LIPID PANEL
Cholesterol: 158 mg/dL (ref 0–200)
HDL: 45.2 mg/dL (ref >39.00)
NonHDL: 112.61
Total CHOL/HDL Ratio: 3
Triglycerides: 210 mg/dL — ABNORMAL HIGH (ref 0.0–149.0)
VLDL: 42 mg/dL — ABNORMAL HIGH (ref 0.0–40.0)

## 2022-03-21 LAB — HEMOGLOBIN A1C: Hgb A1c MFr Bld: 5.5 % (ref 4.6–6.5)

## 2022-03-21 LAB — TSH: TSH: 1.4 u[IU]/mL (ref 0.35–5.50)

## 2022-03-21 LAB — LDL CHOLESTEROL, DIRECT: Direct LDL: 88 mg/dL

## 2022-03-21 MED ORDER — WEGOVY 0.25 MG/0.5ML ~~LOC~~ SOAJ: 0.2500 mg | SUBCUTANEOUS | 0 refills | Status: DC

## 2022-03-22 ENCOUNTER — Encounter: Payer: Self-pay | Admitting: Nurse Practitioner

## 2022-03-22 DIAGNOSIS — N390 Urinary tract infection, site not specified: Secondary | ICD-10-CM

## 2022-03-22 LAB — URINE CULTURE
MICRO NUMBER:: 13164902
SPECIMEN QUALITY:: ADEQUATE

## 2022-03-25 ENCOUNTER — Other Ambulatory Visit: Payer: Self-pay | Admitting: Nurse Practitioner

## 2022-03-25 DIAGNOSIS — E6609 Other obesity due to excess calories: Secondary | ICD-10-CM

## 2022-03-25 NOTE — Telephone Encounter (Signed)
Pt called, an authorization is needed for tSemaglutide-Weight Management (WEGOVY) 0.25 MG/0.5ML SOAJ  ?  ? ? ?

## 2022-03-25 NOTE — Telephone Encounter (Signed)
PA submitted via covermymeds. Patient advised via mychart. ?Waiting for insurance to respond ?(Key: [id number]) ?[employer] is reviewing your PA request. You may close this dialog, return to your dashboard, and perform other tasks. ? ?To check for an update later, open this request again from your dashboard. If [employer] has not replied within 24 hours for urgent requests or within 48 hours for standard requests, please contact [employer] at [phone]. ?

## 2022-03-27 NOTE — Telephone Encounter (Signed)
PA approved for [facility] 03/27/22-06/25/22 ?

## 2022-04-17 ENCOUNTER — Ambulatory Visit: Payer: No Typology Code available for payment source | Admitting: Urology

## 2022-04-18 ENCOUNTER — Ambulatory Visit: Payer: No Typology Code available for payment source | Admitting: Urology

## 2022-04-24 ENCOUNTER — Other Ambulatory Visit: Payer: Self-pay | Admitting: Nurse Practitioner

## 2022-04-24 DIAGNOSIS — E6609 Other obesity due to excess calories: Secondary | ICD-10-CM

## 2022-04-25 ENCOUNTER — Other Ambulatory Visit: Payer: Self-pay | Admitting: Nurse Practitioner

## 2022-04-25 DIAGNOSIS — E6609 Other obesity due to excess calories: Secondary | ICD-10-CM

## 2022-04-25 MED ORDER — WEGOVY 0.5 MG/0.5ML ~~LOC~~ SOAJ: 0.5000 mg | SUBCUTANEOUS | 1 refills | Status: DC

## 2022-04-25 NOTE — Telephone Encounter (Signed)
Patient is calling re: refill for wegovy ? ?Please advise the patient ?

## 2022-05-02 ENCOUNTER — Encounter: Payer: Self-pay | Admitting: Urology

## 2022-05-02 ENCOUNTER — Ambulatory Visit (INDEPENDENT_AMBULATORY_CARE_PROVIDER_SITE_OTHER): Payer: No Typology Code available for payment source | Admitting: Urology

## 2022-05-02 DIAGNOSIS — N390 Urinary tract infection, site not specified: Secondary | ICD-10-CM

## 2022-05-02 LAB — URINALYSIS, COMPLETE
Bilirubin, UA: NEGATIVE
Glucose, UA: NEGATIVE
Ketones, UA: NEGATIVE
Leukocytes,UA: NEGATIVE
Nitrite, UA: NEGATIVE
Protein,UA: NEGATIVE
Specific Gravity, UA: 1.025 (ref 1.005–1.030)
Urobilinogen, Ur: 0.2 mg/dL (ref 0.2–1.0)
pH, UA: 6 (ref 5.0–7.5)

## 2022-05-02 LAB — BLADDER SCAN AMB NON-IMAGING: Scan Result: 0

## 2022-05-02 LAB — MICROSCOPIC EXAMINATION: Epithelial Cells (non renal): >10 /hpf — AB (ref 0–10)

## 2022-05-02 MED ORDER — NITROFURANTOIN MACROCRYSTAL 50 MG PO CAPS: ORAL_CAPSULE | ORAL | 2 refills | Status: DC

## 2022-05-02 NOTE — Progress Notes (Signed)
? ?05/02/2022 ?3:08 PM  ? ?Anita Stanley ?[birth year] ?[id number] ? ?Referring provider: Eden Emms, NP ?[address] [place] ?[place],  [place] [address] ? ?Chief Complaint  ?Patient presents with  ? Recurrent UTI  ? ? ?HPI: ?Anita Stanley is a 36 y.o. female referred for recurrent UTIs ? ?2-year history recurrent UTIs ?Several positive urine cultures for E. coli since 2022 ?No febrile UTIs but did have 2 episodes of pyelonephritis while pregnant ?Symptoms include frequency, urgency, dysuria and pelvic discomfort ?Symptoms typically occur within 24-48 hours of intercourse ?Mild baseline urinary frequency ?Denies gross hematuria ? ? ?PMH: ?Past Medical History:  ?Diagnosis Date  ? Ovarian cyst   ? right   ? Pyelonephritis   ? UTI (urinary tract infection)   ? ? ?Surgical History: ?Past Surgical History:  ?Procedure Laterality Date  ? WRIST SURGERY  2012  ? fx  ? ? ?Home Medications:  ?Allergies as of 05/02/2022   ? ?   Reactions  ? Codeine   ? Morphine And Related   ? Hives after surgery.    ? ?  ? ?  ?Medication List  ?  ? ?  ? Accurate as of May 02, 2022  3:08 PM. If you have any questions, ask your nurse or doctor.  ?  ?  ? ?  ? ?ibuprofen 800 MG tablet ?Commonly known as: ADVIL ?Take 800 mg by mouth 3 (three) times daily as needed. ?  ?levonorgestrel 20 MCG/24HR IUD ?Commonly known as: MIRENA ?1 Intra Uterine Device (1 each total) by Intrauterine route once for 1 dose. ?  ?nitrofurantoin (macrocrystal-monohydrate) 100 MG capsule ?Commonly known as: Macrobid ?Take 1 capsule (100 mg total) by mouth 2 (two) times daily. ?  ?Wegovy 0.5 MG/0.5ML Soaj ?Generic drug: Semaglutide-Weight Management ?Inject 0.5 mg into the skin once a week. ?  ? ?  ? ? ?Allergies:  ?Allergies  ?Allergen Reactions  ? Codeine   ? Morphine And Related   ?  Hives after surgery.    ? ? ?Family History: ?Family History  ?Problem Relation Age of Onset  ? Diabetes Mellitus II Mother   ? Diabetes Mellitus II Father   ? Alcohol abuse Sister   ?  Diabetes Mellitus II Maternal Grandmother   ? Diabetes Mellitus II Maternal Grandfather   ? Diabetes Mellitus II Paternal Grandmother   ? Diabetes Mellitus II Paternal Grandfather   ? ? ?Social History:  reports that she quit smoking about 3 years ago. Her smoking use included cigarettes. She has a 4.50 pack-year smoking history. She has been exposed to tobacco smoke. She has never used smokeless tobacco. She reports current alcohol use. She reports that she does not currently use drugs after having used the following drugs: MDMA (Ecstacy). ? ? ?Physical Exam: ?BP 105/70   Pulse 87   Ht 4\' 11"  (1.499 m)   Wt 180 lb (81.6 kg)   BMI 36.36 kg/m?   ?Constitutional:  Alert and oriented, No acute distress. ?HEENT: [place] AT, moist mucus membranes.  Trachea midline, no masses. ?Cardiovascular: No clubbing, cyanosis, or edema. ?Respiratory: Normal respiratory effort, no increased work of breathing. ?GI: Abdomen is soft, nontender, nondistended, no abdominal masses ?GU: No CVA tenderness ?Skin: No rashes, bruises or suspicious lesions. ?Neurologic: Grossly intact, no focal deficits, moving all 4 extremities. ?Psychiatric: Normal mood and affect. ? ?Laboratory Data: ? ?Urinalysis ?Dipstick 1+ blood ?Microscopy 3-10 RBC, >10 epis, moderate bacteria ? ? ?Assessment & Plan:   ? ?1. Recurrent  UTI ?Symptom onset consistently postcoital ?Recommend postcoital prophylaxis with nitrofurantoin 50 mg after intercourse ?We discussed potential preventative measures including cranberry and D-mannose ?1 year follow-up and instructed call earlier for persistent symptoms with the above treatment ?PVR today ?UA with >10 epis and consistent with vaginal contamination ? ? ?Riki Altes, MD ? ?[facility] ?[address] ?[place], [place] [address] ?[phone] ?

## 2022-05-02 NOTE — Patient Instructions (Signed)
D-mannose and cranberry  

## 2022-05-05 ENCOUNTER — Encounter: Payer: Self-pay | Admitting: Urology

## 2022-05-21 ENCOUNTER — Encounter: Payer: Self-pay | Admitting: Nurse Practitioner

## 2022-05-21 IMAGING — US US PELVIS COMPLETE WITH TRANSVAGINAL
1 series · 13 of 25 positions shown · non-contrast
Comparison: 03/26/2021

CLINICAL DATA: RIGHT ovarian cyst, follow-up, RIGHT lower quadrant
pain; has IUD, unknown LMP



[Series 1: us pelvis complete with transvaginal · 0.21mm/px · 13 of 50 slices shown]
[im 1/50]
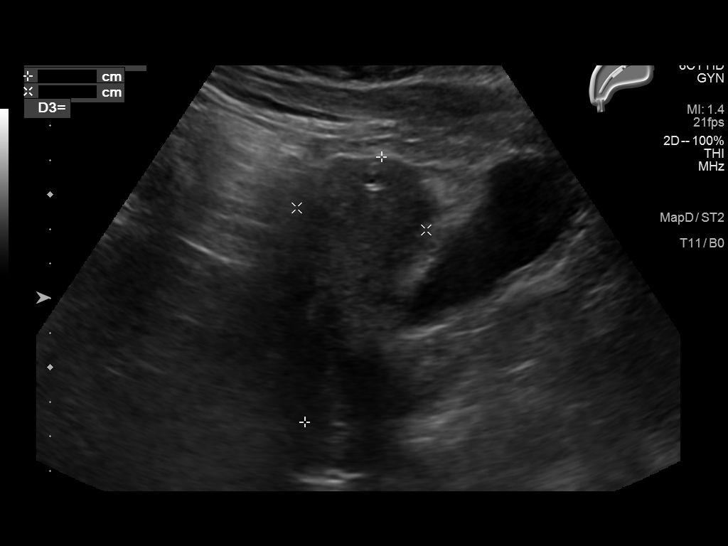
[im 5/50]
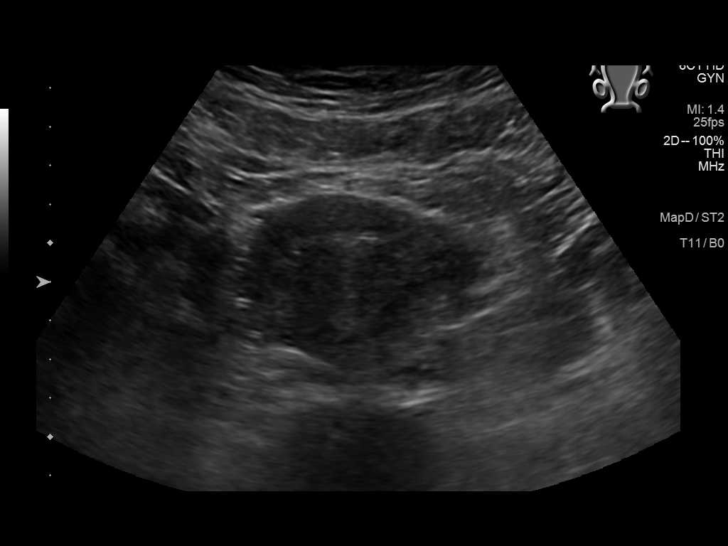
[im 9/50]
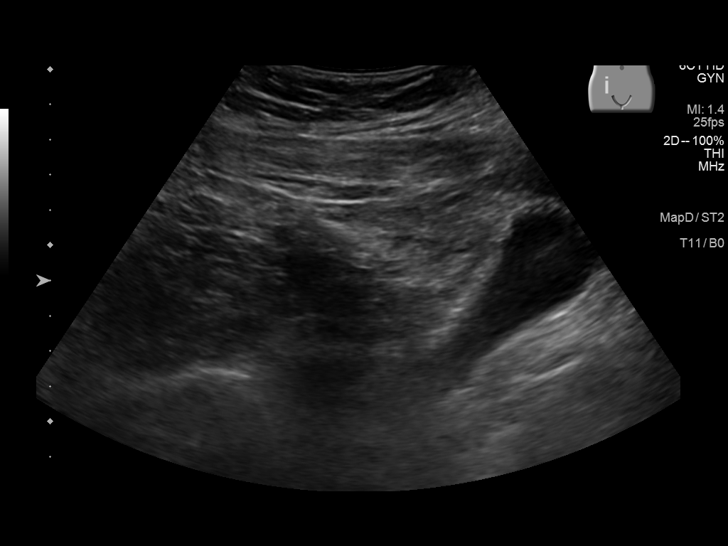
[im 13/50]
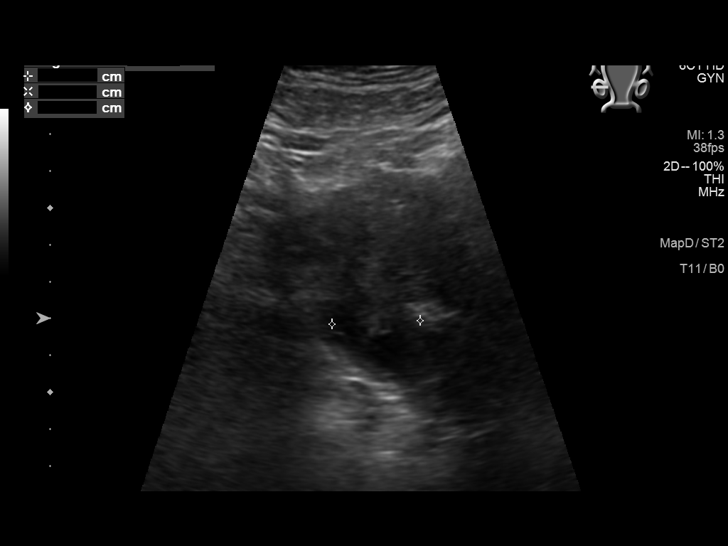
[im 17/50]
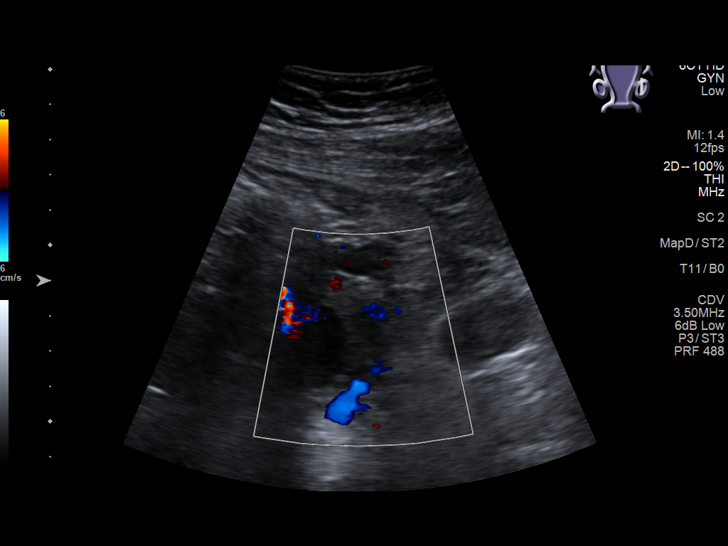
[im 21/50]
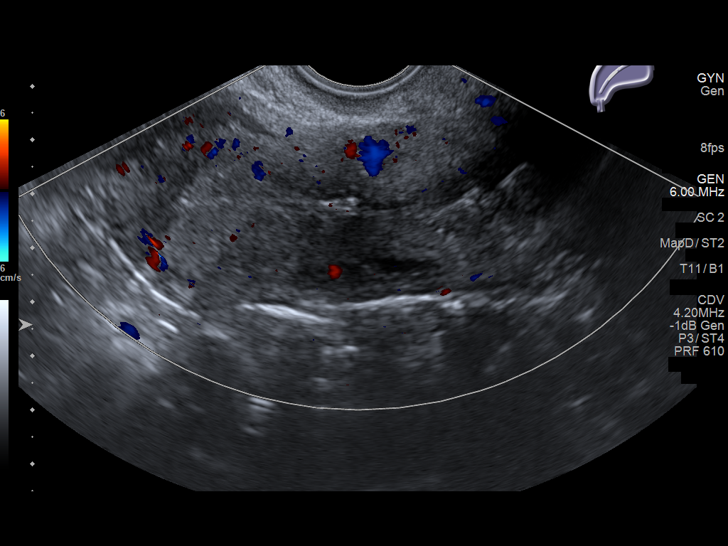
[im 25/50]
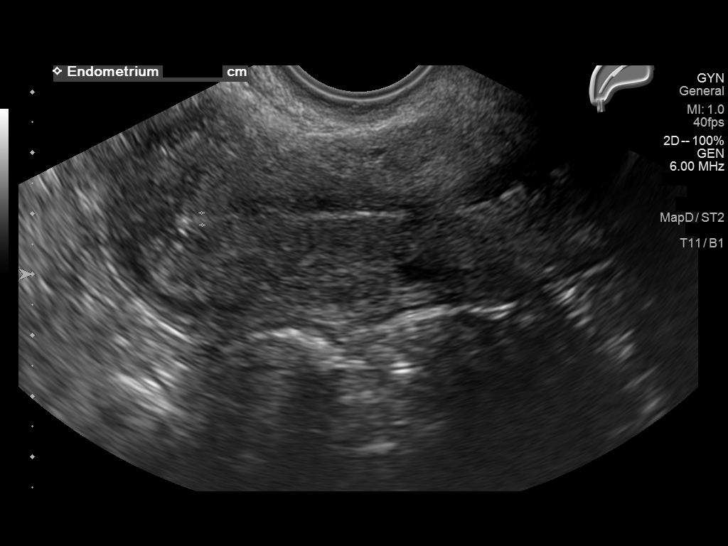
[im 29/50]
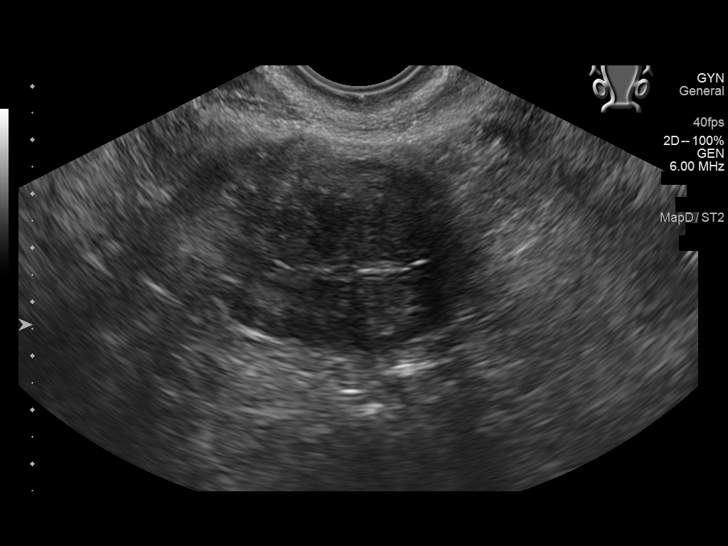
[im 33/50]
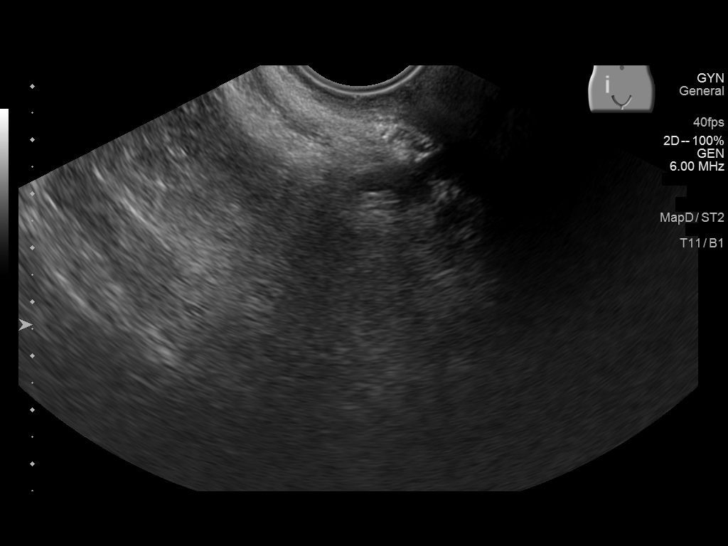
[im 37/50]
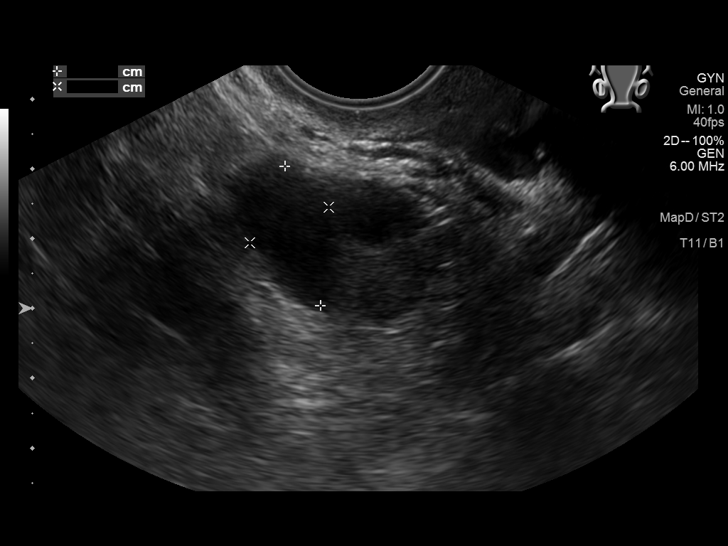
[im 41/50]
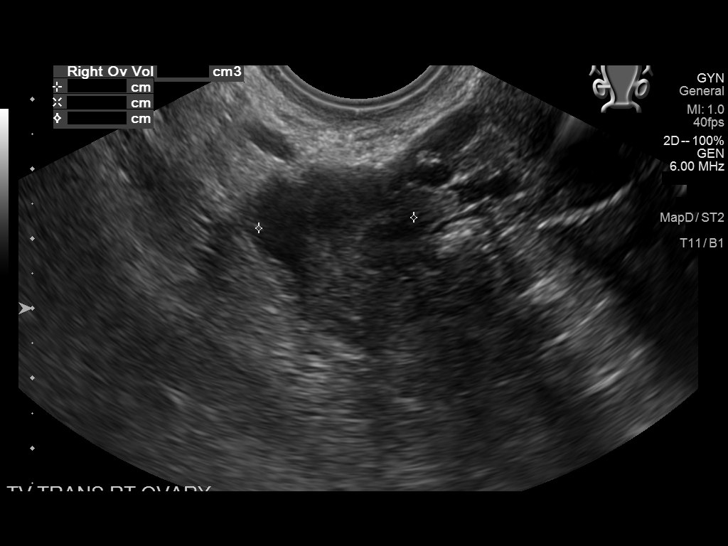
[im 45/50]
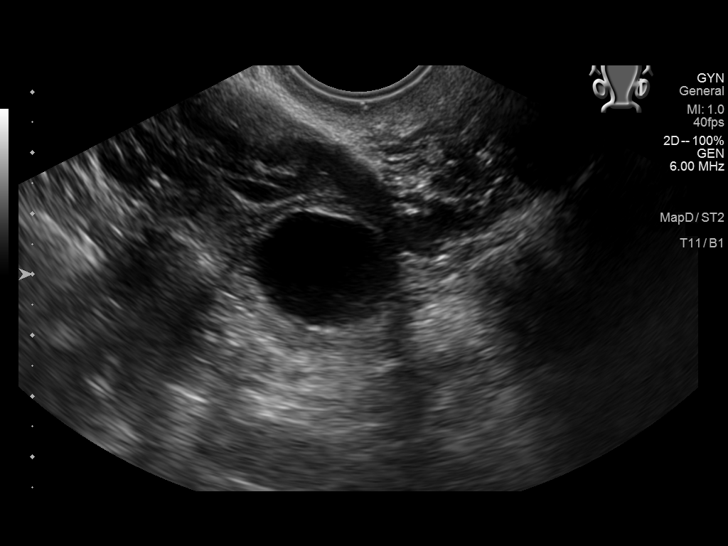
[im 50/50]
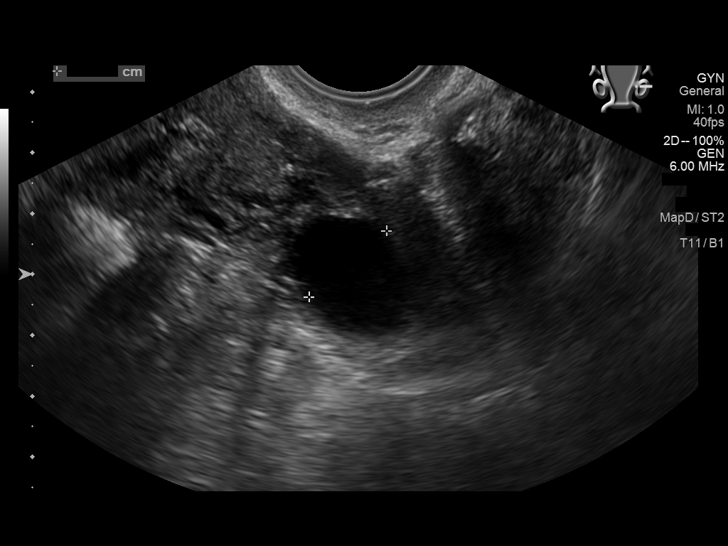

[13 of 25 positions shown; findings below may reference images not displayed]

FINDINGS: Uterus

Measurements: 8.9 x 3.5 x 4.4 cm = volume: 72 mL. Anteverted.
Heterogeneous myometrium. No focal mass.

Endometrium

Thickness: 2 mm. IUD in expected position at upper uterine segment
endometrial canal. No endometrial mass or fluid

Right ovary

Measurements: 3.0 x 2.2 x 2.2 cm = volume: 7.7 mL. Small residual
minimally complicated cyst 2.1 cm greatest size, previously 5.1 cm.
No additional masses.

Left ovary

Measurements: 3.0 x 2.8 x 2.7 cm = volume: 11.9 mL. Normal
morphology without mass

Other findings

No free pelvic fluid.  No adnexal masses.
IMPRESSION: Resolving complicated/hemorrhagic cyst of the RIGHT ovary
significantly decreased in size since prior study.

IUD in expected position within the endometrial canal.

Remainder of exam unremarkable.

## 2022-05-21 MED ORDER — WEGOVY 1 MG/0.5ML ~~LOC~~ SOAJ: 1.0000 mg | SUBCUTANEOUS | 0 refills | Status: DC

## 2022-05-21 NOTE — Telephone Encounter (Signed)
Need a follow up in 1 month for weight recheck and recheck on wegovy please. Also see the mychart message if she is doing well then I will send in the 1mg  dose

## 2022-05-21 NOTE — Telephone Encounter (Signed)
Spoke with patient. Patient is tolerating medication well and agrees to the higher dose. Follow up made for 06/11/22

## 2022-06-13 ENCOUNTER — Ambulatory Visit (INDEPENDENT_AMBULATORY_CARE_PROVIDER_SITE_OTHER): Payer: No Typology Code available for payment source | Admitting: Nurse Practitioner

## 2022-06-13 VITALS — BP 120/76 | HR 74 | Temp 97.6°F | Resp 12 | Ht 59.0 in | Wt 185.4 lb

## 2022-06-13 DIAGNOSIS — R350 Frequency of micturition: Secondary | ICD-10-CM

## 2022-06-13 DIAGNOSIS — H938X1 Other specified disorders of right ear: Secondary | ICD-10-CM

## 2022-06-13 DIAGNOSIS — M545 Low back pain, unspecified: Secondary | ICD-10-CM

## 2022-06-13 DIAGNOSIS — J029 Acute pharyngitis, unspecified: Secondary | ICD-10-CM | POA: Diagnosis not present

## 2022-06-13 DIAGNOSIS — Z6835 Body mass index (BMI) 35.0-35.9, adult: Secondary | ICD-10-CM

## 2022-06-13 DIAGNOSIS — R748 Abnormal levels of other serum enzymes: Secondary | ICD-10-CM

## 2022-06-13 DIAGNOSIS — R110 Nausea: Secondary | ICD-10-CM | POA: Diagnosis not present

## 2022-06-13 DIAGNOSIS — E6609 Other obesity due to excess calories: Secondary | ICD-10-CM | POA: Diagnosis not present

## 2022-06-13 DIAGNOSIS — Z6837 Body mass index (BMI) 37.0-37.9, adult: Secondary | ICD-10-CM

## 2022-06-13 DIAGNOSIS — J020 Streptococcal pharyngitis: Secondary | ICD-10-CM | POA: Diagnosis not present

## 2022-06-13 LAB — POCT URINALYSIS DIPSTICK
Bilirubin, UA: NEGATIVE
Blood, UA: NEGATIVE
Glucose, UA: NEGATIVE
Ketones, UA: NEGATIVE
Leukocytes, UA: NEGATIVE
Nitrite, UA: NEGATIVE
Protein, UA: POSITIVE — AB
Spec Grav, UA: >=1.03 — AB (ref 1.010–1.025)
Urobilinogen, UA: 0.2 E.U./dL
pH, UA: 5.5 (ref 5.0–8.0)

## 2022-06-13 LAB — POCT RAPID STREP A (OFFICE): Rapid Strep A Screen: POSITIVE — AB

## 2022-06-13 MED ORDER — FLUTICASONE PROPIONATE 50 MCG/ACT NA SUSP
2.0000 | Freq: Every day | NASAL | 0 refills | Status: DC
Start: 2022-06-13 — End: 2024-11-18

## 2022-06-13 MED ORDER — AMOXICILLIN 500 MG PO CAPS: 500.0000 mg | ORAL_CAPSULE | Freq: Two times a day (BID) | ORAL | 0 refills | Status: AC

## 2022-06-13 MED ORDER — ONDANSETRON 4 MG PO TBDP: 4.0000 mg | ORAL_TABLET | Freq: Three times a day (TID) | ORAL | 0 refills | Status: DC | PRN

## 2022-06-13 NOTE — Patient Instructions (Signed)
Nice to see you today I sent in the nausea medicine and antibiotics to the pharmacy I will be in touch with the labs once I have them Follow up with me in 3 months for another recheck

## 2022-06-13 NOTE — Assessment & Plan Note (Signed)
Patient has been using Wegovy 1 mg.  States she has been off medication the past 2 weeks because she is felt poorly.  Has lost a month missed amount of weight.  He is having some nausea in regards with [facility].  She has not started exercise yet.  Continue we will give 1 mg likely for the next couple months we will send an antiemetic to aid in nausea feeling work on healthy lifestyle modifications.

## 2022-06-13 NOTE — Progress Notes (Signed)
Established Patient Office Visit  Subjective   Patient ID: Anita Stanley, female    DOB: 1986/06/16  Age: 36 y.o. MRN: [id number]  Chief Complaint  Patient presents with   Weight Check    Follow up   pressure with urination    Low back pain, pressure with urination and frequency x 3 to 4 days.-has been taking Nitrofurantoin 150 mg once daily for the last 3 to 4 days   Sore Throat    2 days ago. Covid test negative on 06/08/22. Has not felt "well."      Weight loss: States that she has been off the medication for the past 2 weeks. Tolerating the medication well. Experiencing some nausea.  With the 1 mg. States that she has not worked a lot on the exercise portion. 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 Her schedule has normalized some   Urinary symptoms: States that she noticed approx 3 days ago. She has been seen by urology and put on prophylactic Macrobid 50 mg daily.  States that she has been taking 3 twice a day totaling 300 mg of Macrobid daily since her symptoms started. She is having low back pain around her flank on the left side  Sore throat: States it started approx 2 days ago. States that she has not been to work fo rthe past week. No sick contacts. States that she checked herself for covid on Saturday and was negative. She was seen and written     Review of Systems  Constitutional:  Negative for chills and fever.  HENT:  Positive for ear pain, sinus pain and sore throat. Negative for ear discharge.   Respiratory:  Negative for cough and shortness of breath.   Gastrointestinal:  Positive for nausea. Negative for abdominal pain and vomiting.  Musculoskeletal:  Positive for back pain and myalgias.  Neurological:  Positive for headaches. Negative for tingling and weakness.      Objective:     BP 120/76   Pulse 74   Temp 97.6 F (36.4 C)   Resp 12   Ht 4\' 11"  (1.499 m)   Wt 185 lb 6 oz (84.1 kg)   SpO2 98%   BMI 37.44 kg/m  BP Readings from Last 3  Encounters:  06/13/22 120/76  05/02/22 105/70  03/20/22 104/64   Wt Readings from Last 3 Encounters:  06/13/22 185 lb 6 oz (84.1 kg)  05/02/22 180 lb (81.6 kg)  03/20/22 186 lb (84.4 kg)      Physical Exam Constitutional:      Appearance: She is well-developed. She is obese.  HENT:     Right Ear: Ear canal and external ear normal.     Left Ear: Tympanic membrane, ear canal and external ear normal.     Mouth/Throat:     Mouth: Mucous membranes are moist.     Pharynx: Posterior oropharyngeal erythema present.  Cardiovascular:     Rate and Rhythm: Normal rate and regular rhythm.  Pulmonary:     Effort: Pulmonary effort is normal.     Breath sounds: Normal breath sounds.  Abdominal:     General: Bowel sounds are normal.  Musculoskeletal:     Lumbar back: No tenderness or bony tenderness. Negative right straight leg raise test and negative left straight leg raise test.       Back:     Right lower leg: No edema.     Left lower leg: No edema.  Lymphadenopathy:  Cervical: Cervical adenopathy present.  Neurological:     General: No focal deficit present.     Mental Status: She is alert.     Deep Tendon Reflexes:     Reflex Scores:      Patellar reflexes are 2+ on the right side and 2+ on the left side.    Comments: Bilateral lower extremity strength 5/5      Results for orders placed or performed in visit on 06/13/22  POCT urinalysis dipstick  Result Value Ref Range   Color, UA orange    Clarity, UA clear    Glucose, UA Negative Negative   Bilirubin, UA negative    Ketones, UA negative    Spec Grav, UA >=1.030 (A) 1.010 - 1.025   Blood, UA negative    pH, UA 5.5 5.0 - 8.0   Protein, UA Positive (A) Negative   Urobilinogen, UA 0.2 0.2 or 1.0 E.U./dL   Nitrite, UA negative    Leukocytes, UA Negative Negative   Appearance     Odor    Rapid Strep A  Result Value Ref Range   Rapid Strep A Screen Positive (A) Negative      The ASCVD Risk score (Arnett DK, et  al., 2019) failed to calculate for the following reasons:   The 2019 ASCVD risk score is only valid for ages 18 to 41    Assessment & Plan:   Problem List Items Addressed This Visit       Respiratory   Strep pharyngitis    Strep test positive in office.  Will elect to treat patient with amoxicillin 500 mg twice daily for 10 days.      Relevant Medications   amoxicillin (AMOXIL) 500 MG capsule     Nervous and Auditory   Pressure sensation in right ear    Fluid behind right TM.  Encourage patient to continue using antihistamine and will send in some Flonase nasal spray 2 sprays each nostril daily.      Relevant Medications   fluticasone (FLONASE) 50 MCG/ACT nasal spray     Other   Obesity    Patient has been using Wegovy 1 mg.  States she has been off medication the past 2 weeks because she is felt poorly.  Has lost a month missed amount of weight.  He is having some nausea in regards with [facility].  She has not started exercise yet.  Continue we will give 1 mg likely for the next couple months we will send an antiemetic to aid in nausea feeling work on healthy lifestyle modifications.      Relevant Orders   CBC   Low back pain   Frequency of urination - Primary    Patient complaining about urinary frequency.  She been taking her prophylactic Macrobid at 150 mg twice daily for the past 3 days.  Informed patient to stop taking medications as to dosing.  UA was normal in office in regards to infectious process but will send off urinary culture.        Relevant Orders   POCT urinalysis dipstick (Completed)   Urine Culture   Nausea    Secondary to [facility] use.  Sent in ondansetron 4 mg 3 times daily as needed nausea      Relevant Medications   ondansetron (ZOFRAN-ODT) 4 MG disintegrating tablet   Elevated liver enzymes    Likely related to obesity.  We will recheck enzymes today slightly elevated last time continue working on weight loss  Relevant Orders   CBC    Comprehensive metabolic panel   Other Visit Diagnoses     Acute sore throat       Relevant Orders   Rapid Strep A (Completed)       Return in about 3 months (around 09/13/2022) for weight and wegovy recheck .    Audria Nine, NP

## 2022-06-13 NOTE — Assessment & Plan Note (Signed)
Strep test positive in office.  Will elect to treat patient with amoxicillin 500 mg twice daily for 10 days.

## 2022-06-13 NOTE — Assessment & Plan Note (Signed)
Likely related to obesity.  We will recheck enzymes today slightly elevated last time continue working on weight loss

## 2022-06-13 NOTE — Assessment & Plan Note (Signed)
Fluid behind right TM.  Encourage patient to continue using antihistamine and will send in some Flonase nasal spray 2 sprays each nostril daily.

## 2022-06-13 NOTE — Assessment & Plan Note (Signed)
Patient complaining about urinary frequency.  She been taking her prophylactic Macrobid at 150 mg twice daily for the past 3 days.  Informed patient to stop taking medications as to dosing.  UA was normal in office in regards to infectious process but will send off urinary culture.

## 2022-06-13 NOTE — Assessment & Plan Note (Signed)
Secondary to [facility] use.  Sent in ondansetron 4 mg 3 times daily as needed nausea

## 2022-06-14 LAB — COMPREHENSIVE METABOLIC PANEL
ALT: 23 U/L (ref 0–35)
AST: 19 U/L (ref 0–37)
Albumin: 4.4 g/dL (ref 3.5–5.2)
Alkaline Phosphatase: 64 U/L (ref 39–117)
BUN: 13 mg/dL (ref 6–23)
CO2: 30 mEq/L (ref 19–32)
Calcium: 9.4 mg/dL (ref 8.4–10.5)
Chloride: 104 mEq/L (ref 96–112)
Creatinine, Ser: 0.69 mg/dL (ref 0.40–1.20)
GFR: 111.75 mL/min (ref >60.00)
Glucose, Bld: 88 mg/dL (ref 70–99)
Potassium: 4.6 mEq/L (ref 3.5–5.1)
Sodium: 140 mEq/L (ref 135–145)
Total Bilirubin: 0.3 mg/dL (ref 0.2–1.2)
Total Protein: 7 g/dL (ref 6.0–8.3)

## 2022-06-14 LAB — CBC
HCT: 39.9 % (ref 36.0–46.0)
Hemoglobin: 13.4 g/dL (ref 12.0–15.0)
MCHC: 33.6 g/dL (ref 30.0–36.0)
MCV: 91.4 fl (ref 78.0–100.0)
Platelets: 254 10*3/uL (ref 150.0–400.0)
RBC: 4.36 Mil/uL (ref 3.87–5.11)
RDW: 12.2 % (ref 11.5–15.5)
WBC: 7.8 10*3/uL (ref 4.0–10.5)

## 2022-06-14 LAB — URINE CULTURE
MICRO NUMBER:: 13530391
Result:: NO GROWTH
SPECIMEN QUALITY:: ADEQUATE

## 2022-06-28 ENCOUNTER — Other Ambulatory Visit: Payer: Self-pay | Admitting: Nurse Practitioner

## 2022-06-28 MED ORDER — WEGOVY 1 MG/0.5ML ~~LOC~~ SOAJ: 1.0000 mg | SUBCUTANEOUS | 1 refills | Status: DC

## 2022-07-01 ENCOUNTER — Telehealth: Payer: Self-pay

## 2022-07-01 NOTE — Telephone Encounter (Signed)
PA for Wegovy 1 mg via covermymeds.-pending  ([employer]) Rx #: [id number] VZCHYI 1MG /0.5ML auto-injectors

## 2022-07-05 ENCOUNTER — Other Ambulatory Visit: Payer: Self-pay | Admitting: Nurse Practitioner

## 2022-07-05 DIAGNOSIS — H938X1 Other specified disorders of right ear: Secondary | ICD-10-CM

## 2022-07-09 NOTE — Telephone Encounter (Signed)
PA approved The request has been approved. The authorization is effective for a maximum of 3 fills from 07/04/2022 to 10/31/2022

## 2022-09-12 ENCOUNTER — Other Ambulatory Visit: Payer: Self-pay | Admitting: Nurse Practitioner

## 2022-09-12 ENCOUNTER — Other Ambulatory Visit: Payer: Self-pay | Admitting: Urology

## 2022-09-12 DIAGNOSIS — H938X1 Other specified disorders of right ear: Secondary | ICD-10-CM

## 2022-09-27 ENCOUNTER — Encounter: Payer: Self-pay | Admitting: Urology

## 2022-10-25 ENCOUNTER — Other Ambulatory Visit: Payer: Self-pay | Admitting: Nurse Practitioner

## 2022-10-25 DIAGNOSIS — E6609 Other obesity due to excess calories: Secondary | ICD-10-CM

## 2022-10-25 DIAGNOSIS — H938X1 Other specified disorders of right ear: Secondary | ICD-10-CM

## 2022-10-28 ENCOUNTER — Other Ambulatory Visit: Payer: Self-pay | Admitting: Nurse Practitioner

## 2022-10-29 NOTE — Telephone Encounter (Signed)
Patient states she has been off [place] since June 2023

## 2022-10-30 ENCOUNTER — Other Ambulatory Visit: Payer: Self-pay | Admitting: Nurse Practitioner

## 2022-10-30 MED ORDER — WEGOVY 0.25 MG/0.5ML ~~LOC~~ SOAJ: 0.2500 mg | SUBCUTANEOUS | 0 refills | Status: DC

## 2022-11-29 ENCOUNTER — Other Ambulatory Visit: Payer: Self-pay | Admitting: Nurse Practitioner

## 2022-11-29 ENCOUNTER — Ambulatory Visit: Payer: No Typology Code available for payment source | Admitting: Nurse Practitioner

## 2022-12-02 ENCOUNTER — Other Ambulatory Visit (HOSPITAL_COMMUNITY): Payer: Self-pay

## 2022-12-02 ENCOUNTER — Telehealth: Payer: Self-pay

## 2022-12-02 NOTE — Telephone Encounter (Signed)
Pharmacy Patient Advocate Encounter   Received notification from [facility] that prior authorization for [facility] 0.25mg /0.1ml is required/requested.  PA submitted on 12/02/2022 to [employer] via CoverMyMeds  Key [id number]  Status is pending

## 2022-12-03 NOTE — Telephone Encounter (Signed)
Pharmacy Patient Advocate Encounter  Received notification from [employer] that the request for prior authorization for Wegovy 0.25MG/0.5ML has been denied due to  .        

## 2022-12-05 NOTE — Telephone Encounter (Signed)
Spoke notifying her Anita Stanley was denied by ins co.  Pt states he was approved in the past. She will call pharmacy and/or ins co to see what the issue is and will let us know.

## 2023-03-06 ENCOUNTER — Other Ambulatory Visit: Payer: Self-pay | Admitting: Urology

## 2023-05-07 ENCOUNTER — Ambulatory Visit: Payer: No Typology Code available for payment source | Admitting: Urology

## 2023-05-12 ENCOUNTER — Encounter: Payer: Self-pay | Admitting: Urology

## 2023-05-12 ENCOUNTER — Ambulatory Visit: Payer: No Typology Code available for payment source | Admitting: Urology

## 2023-05-15 ENCOUNTER — Encounter: Payer: 59 | Admitting: Physician Assistant

## 2023-05-15 ENCOUNTER — Telehealth: Payer: 59 | Admitting: Physician Assistant

## 2023-05-15 DIAGNOSIS — N3000 Acute cystitis without hematuria: Secondary | ICD-10-CM

## 2023-05-15 DIAGNOSIS — J019 Acute sinusitis, unspecified: Secondary | ICD-10-CM

## 2023-05-15 DIAGNOSIS — B9689 Other specified bacterial agents as the cause of diseases classified elsewhere: Secondary | ICD-10-CM | POA: Diagnosis not present

## 2023-05-15 MED ORDER — SULFAMETHOXAZOLE-TRIMETHOPRIM 800-160 MG PO TABS: 1.0000 | ORAL_TABLET | Freq: Two times a day (BID) | ORAL | 0 refills | Status: DC

## 2023-05-15 NOTE — Patient Instructions (Signed)
Sandi Mealy, thank you for joining Margaretann Loveless, PA-C for today's virtual visit.  While this provider is not your primary care provider (PCP), if your PCP is located in our provider database this encounter information will be shared with them immediately following your visit.   A [facility] MyChart account gives you access to today's visit and all your visits, tests, and labs performed at [facility] " click here if you don't have a [facility] MyChart account or go to [email or web address]  Consent: (Patient) Anita Stanley provided verbal consent for this virtual visit at the beginning of the encounter.  Current Medications:  Current Outpatient Medications:    sulfamethoxazole-trimethoprim (BACTRIM DS) 800-160 MG tablet, Take 1 tablet by mouth 2 (two) times daily., Disp: 14 tablet, Rfl: 0   fluticasone (FLONASE) 50 MCG/ACT nasal spray, Place 2 sprays into both nostrils daily., Disp: 16 g, Rfl: 0   gentamicin (GARAMYCIN) 0.3 % ophthalmic solution, SMARTSIG:In Eye(s), Disp: , Rfl:    ibuprofen (ADVIL) 800 MG tablet, Take 800 mg by mouth 3 (three) times daily as needed., Disp: , Rfl:    levonorgestrel (MIRENA) 20 MCG/24HR IUD, 1 Intra Uterine Device (1 each total) by Intrauterine route once for 1 dose., Disp: 1 each, Rfl: 0   nitrofurantoin (MACRODANTIN) 50 MG capsule, TAKE 1 CAPSULE BY MOUTH AFTER INTERCOURSE, Disp: 30 capsule, Rfl: 2   ondansetron (ZOFRAN-ODT) 4 MG disintegrating tablet, Take 1 tablet (4 mg total) by mouth every 8 (eight) hours as needed for nausea or vomiting., Disp: 20 tablet, Rfl: 0   Semaglutide-Weight Management (WEGOVY) 0.25 MG/0.5ML SOAJ, Inject 0.25 mg into the skin once a week., Disp: 2 mL, Rfl: 0   Medications ordered in this encounter:  Meds ordered this encounter  Medications   sulfamethoxazole-trimethoprim (BACTRIM DS) 800-160 MG tablet    Sig: Take 1 tablet by mouth 2 (two) times daily.    Dispense:  14 tablet    Refill:  0     Order Specific Question:   Supervising Provider    Answer:   Merrilee Jansky [id number]     *If you need refills on other medications prior to your next appointment, please contact your pharmacy*  Follow-Up: Call back or seek an in-person evaluation if the symptoms worsen or if the condition fails to improve as anticipated.  [facility] [phone]  Other Instructions  Sinus Infection, Adult A sinus infection, also called sinusitis, is inflammation of your sinuses. Sinuses are hollow spaces in the bones around your face. Your sinuses are located: Around your eyes. In the middle of your forehead. Behind your nose. In your cheekbones. Mucus normally drains out of your sinuses. When your nasal tissues become inflamed or swollen, mucus can become trapped or blocked. This allows bacteria, viruses, and fungi to grow, which leads to infection. Most infections of the sinuses are caused by a virus. A sinus infection can develop quickly. It can last for up to 4 weeks (acute) or for more than 12 weeks (chronic). A sinus infection often develops after a cold. What are the causes? This condition is caused by anything that creates swelling in the sinuses or stops mucus from draining. This includes: Allergies. Asthma. Infection from bacteria or viruses. Deformities or blockages in your nose or sinuses. Abnormal growths in the nose (nasal polyps). Pollutants, such as chemicals or irritants in the air. Infection from fungi. This is rare. What increases the risk? You are more likely to develop this condition  if you: Have a weak body defense system (immune system). Do a lot of swimming or diving. Overuse nasal sprays. Smoke. What are the signs or symptoms? The main symptoms of this condition are pain and a feeling of pressure around the affected sinuses. Other symptoms include: Stuffy nose or congestion that makes it difficult to breathe through your nose. Thick yellow or  greenish drainage from your nose. Tenderness, swelling, and warmth over the affected sinuses. A cough that may get worse at night. Decreased sense of smell and taste. Extra mucus that collects in the throat or the back of the nose (postnasal drip) causing a sore throat or bad breath. Tiredness (fatigue). Fever. How is this diagnosed? This condition is diagnosed based on: Your symptoms. Your medical history. A physical exam. Tests to find out if your condition is acute or chronic. This may include: Checking your nose for nasal polyps. Viewing your sinuses using a device that has a light (endoscope). Testing for allergies or bacteria. Imaging tests, such as an MRI or CT scan. In rare cases, a bone biopsy may be done to rule out more serious types of fungal sinus disease. How is this treated? Treatment for a sinus infection depends on the cause and whether your condition is chronic or acute. If caused by a virus, your symptoms should go away on their own within 10 days. You may be given medicines to relieve symptoms. They include: Medicines that shrink swollen nasal passages (decongestants). A spray that eases inflammation of the nostrils (topical intranasal corticosteroids). Rinses that help get rid of thick mucus in your nose (nasal saline washes). Medicines that treat allergies (antihistamines). Over-the-counter pain relievers. If caused by bacteria, your health care provider may recommend waiting to see if your symptoms improve. Most bacterial infections will get better without antibiotic medicine. You may be given antibiotics if you have: A severe infection. A weak immune system. If caused by narrow nasal passages or nasal polyps, surgery may be needed. Follow these instructions at home: Medicines Take, use, or apply over-the-counter and prescription medicines only as told by your health care provider. These may include nasal sprays. If you were prescribed an antibiotic medicine,  take it as told by your health care provider. Do not stop taking the antibiotic even if you start to feel better. Hydrate and humidify  Drink enough fluid to keep your urine pale yellow. Staying hydrated will help to thin your mucus. Use a cool mist humidifier to keep the humidity level in your home above 50%. Inhale steam for 10-15 minutes, 3-4 times a day, or as told by your health care provider. You can do this in the bathroom while a hot shower is running. Limit your exposure to cool or dry air. Rest Rest as much as possible. Sleep with your head raised (elevated). Make sure you get enough sleep each night. General instructions  Apply a warm, moist washcloth to your face 3-4 times a day or as told by your health care provider. This will help with discomfort. Use nasal saline washes as often as told by your health care provider. Wash your hands often with soap and water to reduce your exposure to germs. If soap and water are not available, use hand sanitizer. Do not smoke. Avoid being around people who are smoking (secondhand smoke). Keep all follow-up visits. This is important. Contact a health care provider if: You have a fever. Your symptoms get worse. Your symptoms do not improve within 10 days. Get help right away  if: You have a severe headache. You have persistent vomiting. You have severe pain or swelling around your face or eyes. You have vision problems. You develop confusion. Your neck is stiff. You have trouble breathing. These symptoms may be an emergency. Get help right away. Call 911. Do not wait to see if the symptoms will go away. Do not drive yourself to the hospital. Summary A sinus infection is soreness and inflammation of your sinuses. Sinuses are hollow spaces in the bones around your face. This condition is caused by nasal tissues that become inflamed or swollen. The swelling traps or blocks the flow of mucus. This allows bacteria, viruses, and fungi to  grow, which leads to infection. If you were prescribed an antibiotic medicine, take it as told by your health care provider. Do not stop taking the antibiotic even if you start to feel better. Keep all follow-up visits. This is important. This information is not intended to replace advice given to you by your health care provider. Make sure you discuss any questions you have with your health care provider. Document Revised: 11/20/2021 Document Reviewed: 11/20/2021 Elsevier Patient Education  2023 Elsevier Inc.   Urinary Tract Infection, Adult  A urinary tract infection (UTI) is an infection of any part of the urinary tract. The urinary tract includes the kidneys, ureters, bladder, and urethra. These organs make, store, and get rid of urine in the body. An upper UTI affects the ureters and kidneys. A lower UTI affects the bladder and urethra. What are the causes? Most urinary tract infections are caused by bacteria in your genital area around your urethra, where urine leaves your body. These bacteria grow and cause inflammation of your urinary tract. What increases the risk? You are more likely to develop this condition if: You have a urinary catheter that stays in place. You are not able to control when you urinate or have a bowel movement (incontinence). You are female and you: Use a spermicide or diaphragm for birth control. Have low estrogen levels. Are pregnant. You have certain genes that increase your risk. You are sexually active. You take antibiotic medicines. You have a condition that causes your flow of urine to slow down, such as: An enlarged prostate, if you are female. Blockage in your urethra. A kidney stone. A nerve condition that affects your bladder control (neurogenic bladder). Not getting enough to drink, or not urinating often. You have certain medical conditions, such as: Diabetes. A weak disease-fighting system (immunesystem). Sickle cell disease. Gout. Spinal  cord injury. What are the signs or symptoms? Symptoms of this condition include: Needing to urinate right away (urgency). Frequent urination. This may include small amounts of urine each time you urinate. Pain or burning with urination. Blood in the urine. Urine that smells bad or unusual. Trouble urinating. Cloudy urine. Vaginal discharge, if you are female. Pain in the abdomen or the lower back. You may also have: Vomiting or a decreased appetite. Confusion. Irritability or tiredness. A fever or chills. Diarrhea. The first symptom in older adults may be confusion. In some cases, they may not have any symptoms until the infection has worsened. How is this diagnosed? This condition is diagnosed based on your medical history and a physical exam. You may also have other tests, including: Urine tests. Blood tests. Tests for STIs (sexually transmitted infections). If you have had more than one UTI, a cystoscopy or imaging studies may be done to determine the cause of the infections. How is this treated? Treatment  for this condition includes: Antibiotic medicine. Over-the-counter medicines to treat discomfort. Drinking enough water to stay hydrated. If you have frequent infections or have other conditions such as a kidney stone, you may need to see a health care provider who specializes in the urinary tract (urologist). In rare cases, urinary tract infections can cause sepsis. Sepsis is a life-threatening condition that occurs when the body responds to an infection. Sepsis is treated in the hospital with IV antibiotics, fluids, and other medicines. Follow these instructions at home:  Medicines Take over-the-counter and prescription medicines only as told by your health care provider. If you were prescribed an antibiotic medicine, take it as told by your health care provider. Do not stop using the antibiotic even if you start to feel better. General instructions Make sure you: Empty  your bladder often and completely. Do not hold urine for long periods of time. Empty your bladder after sex. Wipe from front to back after urinating or having a bowel movement if you are female. Use each tissue only one time when you wipe. Drink enough fluid to keep your urine pale yellow. Keep all follow-up visits. This is important. Contact a health care provider if: Your symptoms do not get better after 1-2 days. Your symptoms go away and then return. Get help right away if: You have severe pain in your back or your lower abdomen. You have a fever or chills. You have nausea or vomiting. Summary A urinary tract infection (UTI) is an infection of any part of the urinary tract, which includes the kidneys, ureters, bladder, and urethra. Most urinary tract infections are caused by bacteria in your genital area. Treatment for this condition often includes antibiotic medicines. If you were prescribed an antibiotic medicine, take it as told by your health care provider. Do not stop using the antibiotic even if you start to feel better. Keep all follow-up visits. This is important. This information is not intended to replace advice given to you by your health care provider. Make sure you discuss any questions you have with your health care provider. Document Revised: 07/28/2020 Document Reviewed: 07/28/2020 Elsevier Patient Education  2023 Elsevier Inc.    If you have been instructed to have an in-person evaluation today at a local Urgent Care facility, please use the link below. It will take you to a list of all of our available [facility], including address, phone number and hours of operation. Please do not delay care.  [facility]  If you or a family member do not have a primary care provider, use the link below to schedule a visit and establish care. When you choose a [facility] primary care physician or advanced practice provider, you gain a long-term partner  in health. Find a Primary Care Provider  Learn more about [facility]'s in-office and virtual care options: [facility] - Get Care Now

## 2023-05-15 NOTE — Progress Notes (Signed)
Patient has video scheduled

## 2023-05-15 NOTE — Telephone Encounter (Signed)
This encounter was created in error - please disregard.

## 2023-05-15 NOTE — Progress Notes (Signed)
Virtual Visit Consent   Anita Stanley, you are scheduled for a virtual visit with a [facility] provider today. Just as with appointments in the office, your consent must be obtained to participate. Your consent will be active for this visit and any virtual visit you may have with one of our providers in the next 365 days. If you have a MyChart account, a copy of this consent can be sent to you electronically.  As this is a virtual visit, video technology does not allow for your provider to perform a traditional examination. This may limit your provider's ability to fully assess your condition. If your provider identifies any concerns that need to be evaluated in person or the need to arrange testing (such as labs, EKG, etc.), we will make arrangements to do so. Although advances in technology are sophisticated, we cannot ensure that it will always work on either your end or our end. If the connection with a video visit is poor, the visit may have to be switched to a telephone visit. With either a video or telephone visit, we are not always able to ensure that we have a secure connection.  By engaging in this virtual visit, you consent to the provision of healthcare and authorize for your insurance to be billed (if applicable) for the services provided during this visit. Depending on your insurance coverage, you may receive a charge related to this service.  I need to obtain your verbal consent now. Are you willing to proceed with your visit today? [name] has provided verbal consent on 05/15/2023 for a virtual visit (video or telephone). Margaretann Loveless, PA-C  Date: 05/15/2023 10:29 AM  Virtual Visit via Video Note   I, Margaretann Loveless, connected with  Anita Stanley  ([id number], 12/12/1986) on 05/15/23 at 10:30 AM EDT by a video-enabled telemedicine application and verified that I am speaking with the correct person using two identifiers.  Location: Patient: Virtual Visit  Location Patient: Home Provider: Virtual Visit Location Provider: Home Office   I discussed the limitations of evaluation and management by telemedicine and the availability of in person appointments. The patient expressed understanding and agreed to proceed.    History of Present Illness: Anita Stanley is a 37 y.o. who identifies as a female who was assigned female at birth, and is being seen today for URI symptoms.  HPI: URI  This is a new problem. The current episode started in the past 7 days. The problem has been gradually worsening. There has been no fever. Associated symptoms include congestion, coughing, ear pain, headaches, nausea, a plugged ear sensation, rhinorrhea, sinus pain, a sore throat and swollen glands. Pertinent negatives include no vomiting. She has tried decongestant and antihistamine for the symptoms.  Urinary Tract Infection  This is a recurrent problem. The current episode started yesterday. The problem has been gradually worsening. The quality of the pain is described as aching and burning. The pain is mild. There has been no fever. Associated symptoms include frequency, hesitancy and nausea. Pertinent negatives include no chills, discharge, flank pain, hematuria, urgency or vomiting. She has tried increased fluids for the symptoms. The treatment provided no relief. Her past medical history is significant for recurrent UTIs.     Problems:  Patient Active Problem List   Diagnosis Date Noted   Strep pharyngitis 06/13/2022   Nausea 06/13/2022   Elevated liver enzymes 06/13/2022   Pressure sensation in right ear 06/13/2022   Frequency of urination 03/20/2022  Preventative health care 03/20/2022   Family history of diabetes mellitus 03/20/2022   Acute cystitis with hematuria 03/20/2022   Dysuria 05/20/2021   Low back pain 05/20/2021   IUD (intrauterine device) in place 11/10/2020   Obesity 08/10/2020   Tinea versicolor 12/13/2019   Pain of upper abdomen  02/02/2019    Allergies:  Allergies  Allergen Reactions   Codeine    Morphine And Codeine     Hives after surgery.     Medications:  Current Outpatient Medications:    sulfamethoxazole-trimethoprim (BACTRIM DS) 800-160 MG tablet, Take 1 tablet by mouth 2 (two) times daily., Disp: 14 tablet, Rfl: 0   fluticasone (FLONASE) 50 MCG/ACT nasal spray, Place 2 sprays into both nostrils daily., Disp: 16 g, Rfl: 0   gentamicin (GARAMYCIN) 0.3 % ophthalmic solution, SMARTSIG:In Eye(s), Disp: , Rfl:    ibuprofen (ADVIL) 800 MG tablet, Take 800 mg by mouth 3 (three) times daily as needed., Disp: , Rfl:    levonorgestrel (MIRENA) 20 MCG/24HR IUD, 1 Intra Uterine Device (1 each total) by Intrauterine route once for 1 dose., Disp: 1 each, Rfl: 0   nitrofurantoin (MACRODANTIN) 50 MG capsule, TAKE 1 CAPSULE BY MOUTH AFTER INTERCOURSE, Disp: 30 capsule, Rfl: 2   ondansetron (ZOFRAN-ODT) 4 MG disintegrating tablet, Take 1 tablet (4 mg total) by mouth every 8 (eight) hours as needed for nausea or vomiting., Disp: 20 tablet, Rfl: 0   Semaglutide-Weight Management (WEGOVY) 0.25 MG/0.5ML SOAJ, Inject 0.25 mg into the skin once a week., Disp: 2 mL, Rfl: 0  Observations/Objective: Patient is well-developed, well-nourished in no acute distress.  Resting comfortably at home.  Head is normocephalic, atraumatic.  No labored breathing.  Speech is clear and coherent with logical content.  Patient is alert and oriented at baseline.    Assessment and Plan: 1. Acute bacterial sinusitis - sulfamethoxazole-trimethoprim (BACTRIM DS) 800-160 MG tablet; Take 1 tablet by mouth 2 (two) times daily.  Dispense: 14 tablet; Refill: 0  2. Acute cystitis without hematuria - sulfamethoxazole-trimethoprim (BACTRIM DS) 800-160 MG tablet; Take 1 tablet by mouth 2 (two) times daily.  Dispense: 14 tablet; Refill: 0  - Worsening symptoms that have not responded to OTC medications.  - Continue allergy medications.  - Steam and  humidifier can help - Stay well hydrated and get plenty of rest.  - Will treat empirically with Bactrim to cover sinus infection and UTI symptoms - May use AZO for bladder spasms - Continue to push fluids.  - Seek in person evaluation if no symptom improvement or if symptoms worsen   Follow Up Instructions: I discussed the assessment and treatment plan with the patient. The patient was provided an opportunity to ask questions and all were answered. The patient agreed with the plan and demonstrated an understanding of the instructions.  A copy of instructions were sent to the patient via MyChart unless otherwise noted below.    The patient was advised to call back or seek an in-person evaluation if the symptoms worsen or if the condition fails to improve as anticipated.  Time:  I spent 8 minutes with the patient via telehealth technology discussing the above problems/concerns.    Margaretann Loveless, PA-C

## 2024-01-23 ENCOUNTER — Ambulatory Visit: Payer: Self-pay | Admitting: Nurse Practitioner

## 2024-01-26 ENCOUNTER — Encounter: Payer: Self-pay | Admitting: Nurse Practitioner

## 2024-03-11 ENCOUNTER — Ambulatory Visit
Admission: EM | Admit: 2024-03-11 | Discharge: 2024-03-11 | Disposition: A | Discharge status: Home / Self Care | Attending: Emergency Medicine | Admitting: Emergency Medicine

## 2024-03-11 DIAGNOSIS — B349 Viral infection, unspecified: Secondary | ICD-10-CM | POA: Diagnosis not present

## 2024-03-11 LAB — POC COVID19/FLU A&B COMBO
Covid Antigen, POC: NEGATIVE
Influenza A Antigen, POC: NEGATIVE
Influenza B Antigen, POC: NEGATIVE

## 2024-03-11 LAB — POCT RAPID STREP A (OFFICE): Rapid Strep A Screen: NEGATIVE

## 2024-03-11 NOTE — Discharge Instructions (Addendum)
 The strep, COVID and flu tests are negative.   Take Tylenol or ibuprofen as needed for fever or discomfort.  Take plain Mucinex as needed for congestion.  Rest and keep yourself hydrated.    Follow-up with your primary care provider if your symptoms are not improving.

## 2024-03-11 NOTE — ED Provider Notes (Signed)
 Anita Stanley    CSN: [id number] Arrival date & time: 03/11/24  1751      History   Chief Complaint Chief Complaint  Patient presents with   Sore Throat   Generalized Body Aches   Headache    HPI Anita Stanley is a 38 y.o. female.  Patient presents with 2-day history of bodyaches, headache, sore throat.  Treating with OTC cold medication.  No fever, cough, shortness of breath, vomiting, diarrhea.  The history is provided by the patient and medical records.    Past Medical History:  Diagnosis Date   Ovarian cyst    right    Pyelonephritis    UTI (urinary tract infection)     Patient Active Problem List   Diagnosis Date Noted   Strep pharyngitis 06/13/2022   Nausea 06/13/2022   Elevated liver enzymes 06/13/2022   Pressure sensation in right ear 06/13/2022   Frequency of urination 03/20/2022   Preventative health care 03/20/2022   Family history of diabetes mellitus 03/20/2022   Acute cystitis with hematuria 03/20/2022   Dysuria 05/20/2021   Low back pain 05/20/2021   IUD (intrauterine device) in place 11/10/2020   Obesity 08/10/2020   Tinea versicolor 12/13/2019   Pain of upper abdomen 02/02/2019    Past Surgical History:  Procedure Laterality Date   WRIST SURGERY  2012   fx    OB History     Gravida  2   Para  2   Term  2   Preterm      AB      Living  2      SAB      IAB      Ectopic      Multiple      Live Births  2            Home Medications    Prior to Admission medications   Medication Sig Start Date End Date Taking? Authorizing Provider  fluticasone (FLONASE) 50 MCG/ACT nasal spray Place 2 sprays into both nostrils daily. Patient not taking: Reported on 03/11/2024 06/13/22   Eden Emms, NP  gentamicin (GARAMYCIN) 0.3 % ophthalmic solution SMARTSIG:In Eye(s) Patient not taking: Reported on 03/11/2024 06/11/22   [provider]  ibuprofen (ADVIL) 800 MG tablet Take 800 mg by mouth 3 (three) times  daily as needed.    [provider]  levonorgestrel (MIRENA) 20 MCG/24HR IUD 1 Intra Uterine Device (1 each total) by Intrauterine route once for 1 dose. 03/23/19 06/13/22  Copland, Ilona Sorrel, PA-C  nitrofurantoin (MACRODANTIN) 50 MG capsule TAKE 1 CAPSULE BY MOUTH AFTER INTERCOURSE Patient not taking: Reported on 03/11/2024 09/13/22   Riki Altes, MD  ondansetron (ZOFRAN-ODT) 4 MG disintegrating tablet Take 1 tablet (4 mg total) by mouth every 8 (eight) hours as needed for nausea or vomiting. Patient not taking: Reported on 03/11/2024 06/13/22   Eden Emms, NP  Semaglutide-Weight Management (WEGOVY) 0.25 MG/0.5ML SOAJ Inject 0.25 mg into the skin once a week. Patient not taking: Reported on 03/11/2024 10/30/22   Eden Emms, NP  sulfamethoxazole-trimethoprim (BACTRIM DS) 800-160 MG tablet Take 1 tablet by mouth 2 (two) times daily. Patient not taking: Reported on 03/11/2024 05/15/23   Margaretann Loveless, PA-C    Family History Family History  Problem Relation Age of Onset   Diabetes Mellitus II Mother    Diabetes Mellitus II Father    Alcohol abuse Sister    Diabetes Mellitus II Maternal Grandmother  Diabetes Mellitus II Maternal Grandfather    Diabetes Mellitus II Paternal Grandmother    Diabetes Mellitus II Paternal Grandfather     Social History Social History   Tobacco Use   Smoking status: Former    Current packs/day: 0.00    Average packs/day: 0.3 packs/day for 18.0 years (4.5 ttl pk-yrs)    Types: Cigarettes    Start date: 07/13/2000    Quit date: 07/13/2018    Years since quitting: 5.6    Passive exposure: Past   Smokeless tobacco: Never  Vaping Use   Vaping status: Some Days  Substance Use Topics   Alcohol use: Yes    Comment: occ   Drug use: Not Currently    Types: MDMA (Ecstacy)     Allergies   Codeine and Morphine and codeine   Review of Systems Review of Systems  Constitutional:  Negative for chills and fever.  HENT:  Positive for sore  throat. Negative for ear pain.   Respiratory:  Negative for cough and shortness of breath.   Gastrointestinal:  Negative for diarrhea and vomiting.  Neurological:  Positive for headaches.     Physical Exam Triage Vital Signs ED Triage Vitals  Encounter Vitals Group     BP 03/11/24 1823 115/74     Systolic BP Percentile --      Diastolic BP Percentile --      Pulse Rate 03/11/24 1823 72     Resp 03/11/24 1823 18     Temp 03/11/24 1823 99.1 F (37.3 C)     Temp src --      SpO2 03/11/24 1823 98 %     Weight --      Height --      Head Circumference --      Peak Flow --      Pain Score 03/11/24 1824 4     Pain Loc --      Pain Education --      Exclude from Growth Chart --    No data found.  Updated Vital Signs BP 115/74   Pulse 72   Temp 99.1 F (37.3 C)   Resp 18   SpO2 98%   Visual Acuity Right Eye Distance:   Left Eye Distance:   Bilateral Distance:    Right Eye Near:   Left Eye Near:    Bilateral Near:     Physical Exam Constitutional:      General: She is not in acute distress. HENT:     Right Ear: Tympanic membrane normal.     Left Ear: Tympanic membrane normal.     Nose: Rhinorrhea present.     Mouth/Throat:     Mouth: Mucous membranes are moist.     Pharynx: Oropharynx is clear.  Cardiovascular:     Rate and Rhythm: Normal rate and regular rhythm.     Heart sounds: Normal heart sounds.  Pulmonary:     Effort: Pulmonary effort is normal. No respiratory distress.     Breath sounds: Normal breath sounds.  Neurological:     Mental Status: She is alert.      UC Treatments / Results  Labs (all labs ordered are listed, but only abnormal results are displayed) Labs Reviewed  POCT RAPID STREP A (OFFICE)  POC COVID19/FLU A&B COMBO    EKG   Radiology No results found.  Procedures Procedures (including critical care time)  Medications Ordered in UC Medications - No data to display  Initial Impression / Assessment and  Plan / UC Course   I have reviewed the triage vital signs and the nursing notes.  Pertinent labs & imaging results that were available during my care of the patient were reviewed by me and considered in my medical decision making (see chart for details).    Viral illness.  Rapid strep negative.  Rapid COVID and flu negative.  Discussed symptomatic treatment including Tylenol or ibuprofen as needed for fever or discomfort, plain Mucinex as needed for congestion, rest, hydration.  Instructed patient to follow-up with PCP if not improving.  ED precautions given.  Patient agrees to plan of care.  Final Clinical Impressions(s) / UC Diagnoses   Final diagnoses:  Viral illness     Discharge Instructions      The strep, COVID and flu tests are negative.   Take Tylenol or ibuprofen as needed for fever or discomfort.  Take plain Mucinex as needed for congestion.  Rest and keep yourself hydrated.    Follow-up with your primary care provider if your symptoms are not improving.         ED Prescriptions   None    PDMP not reviewed this encounter.   Mickie Bail, NP 03/11/24 [phone]

## 2024-03-11 NOTE — ED Triage Notes (Signed)
 Patient to Urgent Care with complaints of sore throat/ headaches/ body aches. Possible fevers.   Reports symptoms started two days ago.   Taking tylenol and advil cold/ sinus.

## 2024-05-30 ENCOUNTER — Emergency Department

## 2024-05-30 ENCOUNTER — Emergency Department
Admission: EM | Admit: 2024-05-30 | Discharge: 2024-05-30 | Disposition: A | Discharge status: Against Medical Advice | Attending: Student | Admitting: Student

## 2024-05-30 ENCOUNTER — Other Ambulatory Visit: Payer: Self-pay

## 2024-05-30 DIAGNOSIS — R2232 Localized swelling, mass and lump, left upper limb: Secondary | ICD-10-CM | POA: Insufficient documentation

## 2024-05-30 DIAGNOSIS — Z5321 Procedure and treatment not carried out due to patient leaving prior to being seen by health care provider: Secondary | ICD-10-CM | POA: Diagnosis not present

## 2024-05-30 NOTE — ED Triage Notes (Signed)
 Pt sts that she has been having left thumb swelling since she woke up this AM. Pt sts that he is not able to bend her thumb.

## 2024-06-03 ENCOUNTER — Telehealth: Payer: Self-pay

## 2024-06-03 NOTE — Telephone Encounter (Signed)
 Noted

## 2024-06-03 NOTE — Transitions of Care (Post Inpatient/ED Visit) (Signed)
 Unable to reach patient by phone and left v/m requesting call back at [phone].       06/03/2024  Name: Anita Stanley MRN: [id number] DOB: [birth year]  Today's TOC FU Call Status: Today's TOC FU Call Status:: Unsuccessful Call (1st Attempt) Unsuccessful Call (1st Attempt) Date: 06/03/24  Attempted to reach the patient regarding the most recent Inpatient/ED visit.  Follow Up Plan: Additional outreach attempts will be made to reach the patient to complete the Transitions of Care (Post Inpatient/ED visit) call.   Signature  Claretha Crocker, LPN

## 2024-06-03 NOTE — Telephone Encounter (Signed)
 Copied from CRM [id number]. Topic: General - Call Back - No Documentation >> Jun 03, 2024 11:37 AM Rosaria Common wrote: Reason for CRM: Pt returning phone call to Katheleen Palmer, LPN. Called clinic and was advised to leave a CRM. Callback number is [phone].

## 2024-06-03 NOTE — Transitions of Care (Post Inpatient/ED Visit) (Signed)
 Pt went to [facility] ED on 05/30/24 ; pt thought might have cellulitis of thumb and after xray done pt decided to leave ED and go to teledoc visit with pts insurance on 05/31/24. Pt was given Keflex  to take tid and pt is still taking abx.Pts thumb is much better and pt declines FU visit with PCP at this time., pt will cb if needed for appt.. sending note to Rosita Cools NP.         06/03/2024  Name: Anita Stanley MRN: [id number] DOB: 1986/02/14  Today's TOC FU Call Status: Today's TOC FU Call Status:: Successful TOC FU Call Completed Unsuccessful Call (1st Attempt) Date: 06/03/24 [facility] FU Call Complete Date: 06/03/24 Patient's Name and Date of Birth confirmed.  Transition Care Management Follow-up Telephone Call Date of Discharge: 05/30/24 Discharge Facility: [facility]) Type of Discharge: Emergency Department Reason for ED Visit: Other: (pt went to [facility] ED on 05/30/24 due to pt thinking she had cellulitis of thumb;after xray pt said ED was busy pt went home pt went to Teledoc appt thru pts ins co on 05/31/24. pt was given Keflex  to take tid; pt is still taking abx and thumb is better.) How have you been since you were released from the hospital?: Better Any questions or concerns?: No  Items Reviewed: Did you receive and understand the discharge instructions provided?: No (pt left without being seen.) Medications obtained,verified, and reconciled?: Partial Review Completed  Medications Reviewed Today: Medications Reviewed Today   Medications were not reviewed in this encounter     Home Care and Equipment/Supplies:none given or needed.    Functional Questionnaire:pt performing usual daily activities without problem.    Follow up appointments reviewed:pt doesnot want to FU appt at this time.      SIGNATURE Claretha Crocker, LPN

## 2024-10-04 ENCOUNTER — Encounter: Admitting: Nurse Practitioner

## 2024-10-11 ENCOUNTER — Ambulatory Visit: Admitting: Nurse Practitioner

## 2024-10-11 NOTE — Progress Notes (Deleted)
   Established Patient Office Visit  Subjective   Patient ID: Anita Stanley, female    DOB: 1986-02-21  Age: 38 y.o. MRN: [id number]  No chief complaint on file.   HPI  for complete physical and follow up of chronic conditions.  Immunizations: -Tetanus: Completed in 2015 -Influenza:? -Shingles: Too young -Pneumonia: Too young -HPV:  Diet: Fair diet.  Exercise: No regular exercise.  Eye exam: Completes annually  Dental exam: Completes semi-annually    Colonoscopy: Too young, currently average risk Lung Cancer Screening: Completed in   Pap smear: Due, 03/23/2019 that was negative.    Mammogram: Too young, currently average risk  DEXA: Too young  Sleep:    {History (Optional):23778}  ROS    Objective:     There were no vitals taken for this visit. {Vitals History (Optional):23777}  Physical Exam   No results found for any visits on 10/11/24.  {Labs (Optional):23779}  The ASCVD Risk score (Arnett DK, et al., 2019) failed to calculate for the following reasons:   The 2019 ASCVD risk score is only valid for ages 35 to 29    Assessment & Plan:   Problem List Items Addressed This Visit   None   No follow-ups on file.    Adina Crandall, NP

## 2024-11-18 ENCOUNTER — Ambulatory Visit (INDEPENDENT_AMBULATORY_CARE_PROVIDER_SITE_OTHER): Admitting: Nurse Practitioner

## 2024-11-18 VITALS — BP 102/70 | HR 81 | Temp 98.4°F | Ht 59.0 in | Wt 182.4 lb

## 2024-11-18 DIAGNOSIS — Z23 Encounter for immunization: Secondary | ICD-10-CM

## 2024-11-18 DIAGNOSIS — F411 Generalized anxiety disorder: Secondary | ICD-10-CM

## 2024-11-18 DIAGNOSIS — F909 Attention-deficit hyperactivity disorder, unspecified type: Secondary | ICD-10-CM

## 2024-11-18 NOTE — Progress Notes (Signed)
 Established Patient Office Visit  Subjective   Patient ID: BARBARAANN AVANS, female    DOB: 1986/07/18  Age: 38 y.o. MRN: [id number]  Chief Complaint  Patient presents with   Medication Management    Pt would like her psychiatric medications to be prescribed by PCP due to expensive copay and prescriptions.      HPI  Discussed the use of AI scribe software for clinical note transcription with the patient, who gave verbal consent to proceed.  History of Present Illness SAFIRA PROFFIT is a 38 year old female with ADHD who presents for medication management and follow-up.  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 She is currently taking Adderall on workdays, which helps her focus and reduces persistent thoughts. She last took Adderall yesterday and has no issues with palpitations or sleep disturbances while on the medication. She recently refilled her prescription and uses [employer].  She is currently being seen through employee assistance network for therapy once a month  She also takes Zoloft (sertraline) for anxiety, which she has reduced to 50 mg from 100 mg. She wants to eventually wean off the medication but acknowledges its current necessity. She also uses hydroxyzine as needed for anxiety and reports having an adequate supply. No thoughts of self-harm, hallucinations, or other psychiatric symptoms.  There is a family history of ADHD, with both her brother and father affected. She experiences cravings for crunchy foods at night, which she associates with her past smoking habit. She has not received a flu shot this year. 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 She uses an employee assistance program for therapy and quit smoking years ago.     Review of Systems  Constitutional:  Negative for chills and fever.  Respiratory:  Negative for  shortness of breath.   Cardiovascular:  Negative for chest pain and palpitations.  Neurological:  Negative for headaches.  Psychiatric/Behavioral:  Negative for hallucinations and suicidal ideas. The patient does not have insomnia.       Objective:     BP 102/70   Pulse 81   Temp 98.4 F (36.9 C) (Oral)   Ht 4' 11 (1.499 m)   Wt 182 lb 6.4 oz (82.7 kg)   SpO2 97%   BMI 36.84 kg/m  BP Readings from Last 3 Encounters:  11/18/24 102/70  03/11/24 115/74  06/13/22 120/76   Wt Readings from Last 3 Encounters:  11/18/24 182 lb 6.4 oz (82.7 kg)  06/13/22 185 lb 6 oz (84.1 kg)  05/02/22 180 lb (81.6 kg)   SpO2 Readings from Last 3 Encounters:  11/18/24 97%  03/11/24 98%  06/13/22 98%      Physical Exam Vitals and nursing note reviewed.  Constitutional:      Appearance: Normal appearance.  Cardiovascular:     Rate and Rhythm: Normal rate and regular rhythm.     Heart sounds: Normal heart sounds.  Pulmonary:     Effort: Pulmonary effort is normal.     Breath sounds: Normal breath sounds.  Neurological:     Mental Status: She is alert.      No results found for any visits on 11/18/24.    The ASCVD Risk score (Arnett DK, et al., 2019) failed to calculate for the following reasons:   The 2019 ASCVD risk score is only valid for ages 67 to  79    Assessment & Plan:   Problem List Items Addressed This Visit   None Visit Diagnoses       Attention deficit hyperactivity disorder (ADHD), unspecified ADHD type    -  Primary   Relevant Orders   CBC   Comprehensive metabolic panel with GFR   TSH   DRUG MONITORING, PANEL 8 WITH CONFIRMATION, URINE     GAD (generalized anxiety disorder)       Relevant Medications   sertraline (ZOLOFT) 100 MG tablet   hydrOXYzine (ATARAX) 25 MG tablet   Other Relevant Orders   CBC   Comprehensive metabolic panel with GFR   TSH     Need for influenza vaccination       Relevant Orders   Flu vaccine trivalent PF, 6mos and  older(Flulaval,Afluria,Fluarix,Fluzone) (Completed)     Assessment and Plan Assessment & Plan Attention-deficit hyperactivity disorder, predominantly inattentive type ADHD symptoms worsened after job change. Adderall effective for work-related symptoms. No adverse effects reported. - Continue Adderall as needed for work. - Signed controlled substance agreement. - Ordered blood work and urine sample for liver and kidney function monitoring. - Scheduled follow-up in three months. - Instructed to message via MyChart for Adderall refill.  Major depressive disorder and generalized anxiety disorder Managed with Zoloft 50 mg and hydroxyzine. No suicidal ideation or hallucinations. Prefers current regimen, open to future discussions on Zoloft. - Continue Zoloft 50 mg. - Use hydroxyzine as needed for anxiety. - Discuss potential Zoloft weaning in future visits.  General Health Maintenance Flu shot not received this year. - Administered flu shot today.   Return in about 3 months (around 02/18/2025) for ADD/ADHD med recheck .    Adina Crandall, NP

## 2024-11-18 NOTE — Patient Instructions (Addendum)
 Nice to see you today  I will in touch with the labs once I have them  Follow up with me in 3 months, sooner if you need me   We did update your flu vaccine today

## 2024-11-19 LAB — COMPREHENSIVE METABOLIC PANEL WITH GFR
ALT: 17 U/L (ref 0–35)
AST: 18 U/L (ref 0–37)
Albumin: 4.5 g/dL (ref 3.5–5.2)
Alkaline Phosphatase: 60 U/L (ref 39–117)
BUN: 17 mg/dL (ref 6–23)
CO2: 26 meq/L (ref 19–32)
Calcium: 8.5 mg/dL (ref 8.4–10.5)
Chloride: 105 meq/L (ref 96–112)
Creatinine, Ser: 0.71 mg/dL (ref 0.40–1.20)
GFR: 107.63 mL/min (ref >60.00)
Glucose, Bld: 75 mg/dL (ref 70–99)
Potassium: 3.8 meq/L (ref 3.5–5.1)
Sodium: 139 meq/L (ref 135–145)
Total Bilirubin: 0.2 mg/dL (ref 0.2–1.2)
Total Protein: 6.8 g/dL (ref 6.0–8.3)

## 2024-11-19 LAB — CBC
HCT: 41.2 % (ref 36.0–46.0)
Hemoglobin: 13.7 g/dL (ref 12.0–15.0)
MCHC: 33.2 g/dL (ref 30.0–36.0)
MCV: 92 fl (ref 78.0–100.0)
Platelets: 232 K/uL (ref 150.0–400.0)
RBC: 4.48 Mil/uL (ref 3.87–5.11)
RDW: 12.4 % (ref 11.5–15.5)
WBC: 7 K/uL (ref 4.0–10.5)

## 2024-11-19 LAB — TSH: TSH: 1.06 u[IU]/mL (ref 0.35–5.50)

## 2024-11-20 LAB — DRUG MONITORING, PANEL 8 WITH CONFIRMATION, URINE
6 Acetylmorphine: NEGATIVE ng/mL (ref <10)
Alcohol Metabolites: NEGATIVE ng/mL (ref <500)
Amphetamine: 2782 ng/mL — ABNORMAL HIGH (ref <250)
Amphetamines: POSITIVE ng/mL — AB (ref <500)
Benzodiazepines: NEGATIVE ng/mL (ref <100)
Buprenorphine, Urine: NEGATIVE ng/mL (ref <5)
Cocaine Metabolite: NEGATIVE ng/mL (ref <150)
Creatinine: 94.4 mg/dL (ref >=20.0)
MDMA: NEGATIVE ng/mL (ref <500)
Marijuana Metabolite: NEGATIVE ng/mL (ref <20)
Methamphetamine: NEGATIVE ng/mL (ref <250)
Opiates: NEGATIVE ng/mL (ref <100)
Oxidant: NEGATIVE ug/mL (ref <200)
Oxycodone: NEGATIVE ng/mL (ref <100)
pH: 7.1 (ref 4.5–9.0)

## 2024-11-20 LAB — DM TEMPLATE

## 2024-11-23 ENCOUNTER — Ambulatory Visit: Payer: Self-pay | Admitting: Nurse Practitioner

## 2024-12-07 ENCOUNTER — Other Ambulatory Visit: Payer: Self-pay | Admitting: Nurse Practitioner

## 2024-12-07 MED ORDER — AMPHETAMINE-DEXTROAMPHETAMINE 20 MG PO TABS: 20.0000 mg | ORAL_TABLET | Freq: Every day | ORAL | 0 refills | Status: DC

## 2025-01-20 ENCOUNTER — Other Ambulatory Visit: Payer: Self-pay | Admitting: Nurse Practitioner

## 2025-01-20 NOTE — Telephone Encounter (Signed)
 Can we check with patient and see if she is suppose to be on the adderall 20mg  XR? I sent in the IR last time

## 2025-01-21 NOTE — Telephone Encounter (Signed)
 Called patient she is taking the 20mg  XR

## 2025-02-18 ENCOUNTER — Ambulatory Visit: Admitting: Nurse Practitioner

## 2025-04-20 ENCOUNTER — Encounter: Admitting: Nurse Practitioner
